# Patient Record
Sex: Female | Born: 1995 | Race: Black or African American | Hispanic: No | Marital: Single | Smoking: Former smoker
Health system: Southern US, Community
[De-identification: ages and names within clinical notes are randomized; demographics above are authoritative.]

## PROBLEM LIST (undated history)

## (undated) DIAGNOSIS — G8929 Other chronic pain: Secondary | ICD-10-CM

## (undated) DIAGNOSIS — R112 Nausea with vomiting, unspecified: Secondary | ICD-10-CM

## (undated) DIAGNOSIS — N926 Irregular menstruation, unspecified: Secondary | ICD-10-CM

## (undated) DIAGNOSIS — R6884 Jaw pain: Secondary | ICD-10-CM

## (undated) DIAGNOSIS — R143 Flatulence: Secondary | ICD-10-CM

## (undated) DIAGNOSIS — R141 Gas pain: Secondary | ICD-10-CM

## (undated) DIAGNOSIS — K219 Gastro-esophageal reflux disease without esophagitis: Secondary | ICD-10-CM

## (undated) DIAGNOSIS — M65849 Other synovitis and tenosynovitis, unspecified hand: Secondary | ICD-10-CM

## (undated) DIAGNOSIS — B373 Candidiasis of vulva and vagina: Secondary | ICD-10-CM

## (undated) DIAGNOSIS — M65839 Other synovitis and tenosynovitis, unspecified forearm: Secondary | ICD-10-CM

## (undated) DIAGNOSIS — H6120 Impacted cerumen, unspecified ear: Secondary | ICD-10-CM

## (undated) DIAGNOSIS — F32A Depression, unspecified: Secondary | ICD-10-CM

## (undated) DIAGNOSIS — F329 Major depressive disorder, single episode, unspecified: Secondary | ICD-10-CM

## (undated) DIAGNOSIS — L509 Urticaria, unspecified: Secondary | ICD-10-CM

## (undated) DIAGNOSIS — Q828 Other specified congenital malformations of skin: Secondary | ICD-10-CM

## (undated) DIAGNOSIS — M419 Scoliosis, unspecified: Secondary | ICD-10-CM

## (undated) DIAGNOSIS — R142 Eructation: Secondary | ICD-10-CM

## (undated) DIAGNOSIS — B354 Tinea corporis: Secondary | ICD-10-CM

## (undated) DIAGNOSIS — R1031 Right lower quadrant pain: Secondary | ICD-10-CM

## (undated) DIAGNOSIS — N72 Inflammatory disease of cervix uteri: Secondary | ICD-10-CM

## (undated) DIAGNOSIS — D573 Sickle-cell trait: Secondary | ICD-10-CM

## (undated) DIAGNOSIS — M25571 Pain in right ankle and joints of right foot: Secondary | ICD-10-CM

## (undated) DIAGNOSIS — R109 Unspecified abdominal pain: Secondary | ICD-10-CM

## (undated) HISTORY — DX: Impacted cerumen, unspecified ear: H61.20

## (undated) HISTORY — PX: WISDOM TOOTH EXTRACTION: SHX21

## (undated) HISTORY — DX: Inflammatory disease of cervix uteri: N72

## (undated) HISTORY — DX: Eructation: R14.2

## (undated) HISTORY — DX: Tinea corporis: B35.4

## (undated) HISTORY — DX: Other specified congenital malformations of skin: Q82.8

## (undated) HISTORY — DX: Jaw pain: R68.84

## (undated) HISTORY — DX: Candidiasis of vulva and vagina: B37.3

## (undated) HISTORY — DX: Right lower quadrant pain: R10.31

## (undated) HISTORY — DX: Other synovitis and tenosynovitis, unspecified forearm: M65.839

## (undated) HISTORY — DX: Depression, unspecified: F32.A

## (undated) HISTORY — DX: Major depressive disorder, single episode, unspecified: F32.9

## (undated) HISTORY — DX: Urticaria, unspecified: L50.9

## (undated) HISTORY — DX: Flatulence: R14.3

## (undated) HISTORY — DX: Gas pain: R14.1

## (undated) HISTORY — DX: Pain in right ankle and joints of right foot: M25.571

## (undated) HISTORY — DX: Other synovitis and tenosynovitis, unspecified hand: M65.849

## (undated) HISTORY — PX: EXTERNAL EAR SURGERY: SHX627

---
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
## 2013-01-06 ENCOUNTER — Ambulatory Visit (INDEPENDENT_AMBULATORY_CARE_PROVIDER_SITE_OTHER): Payer: Medicaid Other | Admitting: Pediatrics

## 2013-01-06 ENCOUNTER — Encounter: Payer: Self-pay | Admitting: Pediatrics

## 2013-01-06 VITALS — BP 110/70 | Wt 144.6 lb

## 2013-01-06 DIAGNOSIS — R05 Cough: Secondary | ICD-10-CM

## 2013-01-06 DIAGNOSIS — N899 Noninflammatory disorder of vagina, unspecified: Secondary | ICD-10-CM

## 2013-01-06 DIAGNOSIS — R197 Diarrhea, unspecified: Secondary | ICD-10-CM

## 2013-01-06 DIAGNOSIS — F411 Generalized anxiety disorder: Secondary | ICD-10-CM

## 2013-01-06 DIAGNOSIS — Z3202 Encounter for pregnancy test, result negative: Secondary | ICD-10-CM

## 2013-01-06 DIAGNOSIS — M25579 Pain in unspecified ankle and joints of unspecified foot: Secondary | ICD-10-CM

## 2013-01-06 DIAGNOSIS — R109 Unspecified abdominal pain: Secondary | ICD-10-CM

## 2013-01-06 DIAGNOSIS — M25571 Pain in right ankle and joints of right foot: Secondary | ICD-10-CM

## 2013-01-06 DIAGNOSIS — J309 Allergic rhinitis, unspecified: Secondary | ICD-10-CM

## 2013-01-06 DIAGNOSIS — N898 Other specified noninflammatory disorders of vagina: Secondary | ICD-10-CM

## 2013-01-06 DIAGNOSIS — K589 Irritable bowel syndrome without diarrhea: Secondary | ICD-10-CM | POA: Insufficient documentation

## 2013-01-06 DIAGNOSIS — Z309 Encounter for contraceptive management, unspecified: Secondary | ICD-10-CM

## 2013-01-06 DIAGNOSIS — Z1389 Encounter for screening for other disorder: Secondary | ICD-10-CM

## 2013-01-06 LAB — SEDIMENTATION RATE: Sed Rate: 4 mm/hr (ref 0–22)

## 2013-01-06 LAB — POCT URINALYSIS DIPSTICK
Bilirubin, UA: NEGATIVE
Blood, UA: 50
Nitrite, UA: NEGATIVE
Urobilinogen, UA: NEGATIVE
pH, UA: 5

## 2013-01-06 LAB — POCT URINE PREGNANCY: Preg Test, Ur: NEGATIVE

## 2013-01-06 LAB — LIPASE: Lipase: 30 U/L (ref 0–75)

## 2013-01-06 MED ORDER — ALBUTEROL SULFATE HFA 108 (90 BASE) MCG/ACT IN AERS: 2.0000 | INHALATION_SPRAY | RESPIRATORY_TRACT | Status: DC | PRN

## 2013-01-06 MED ORDER — ETONOGESTREL-ETHINYL ESTRADIOL 0.12-0.015 MG/24HR VA RING: VAGINAL_RING | VAGINAL | Status: DC

## 2013-01-06 MED ORDER — BECLOMETHASONE DIPROPIONATE 80 MCG/ACT IN AERS: 1.0000 | INHALATION_SPRAY | Freq: Two times a day (BID) | RESPIRATORY_TRACT | Status: DC
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
## 2013-01-09 ENCOUNTER — Emergency Department (HOSPITAL_COMMUNITY)
Admission: EM | Admit: 2013-01-09 | Discharge: 2013-01-10 | Disposition: A | Discharge status: Home / Self Care | Payer: Medicaid Other | Attending: Emergency Medicine | Admitting: Emergency Medicine

## 2013-01-09 ENCOUNTER — Encounter (HOSPITAL_COMMUNITY): Payer: Self-pay | Admitting: Emergency Medicine

## 2013-01-09 DIAGNOSIS — F17200 Nicotine dependence, unspecified, uncomplicated: Secondary | ICD-10-CM | POA: Insufficient documentation

## 2013-01-09 DIAGNOSIS — Z79899 Other long term (current) drug therapy: Secondary | ICD-10-CM | POA: Insufficient documentation

## 2013-01-09 DIAGNOSIS — IMO0002 Reserved for concepts with insufficient information to code with codable children: Secondary | ICD-10-CM | POA: Insufficient documentation

## 2013-01-09 DIAGNOSIS — N83209 Unspecified ovarian cyst, unspecified side: Secondary | ICD-10-CM | POA: Insufficient documentation

## 2013-01-09 DIAGNOSIS — R109 Unspecified abdominal pain: Secondary | ICD-10-CM

## 2013-01-09 DIAGNOSIS — Z3202 Encounter for pregnancy test, result negative: Secondary | ICD-10-CM | POA: Insufficient documentation

## 2013-01-09 DIAGNOSIS — Q828 Other specified congenital malformations of skin: Secondary | ICD-10-CM | POA: Insufficient documentation

## 2013-01-09 DIAGNOSIS — K5900 Constipation, unspecified: Secondary | ICD-10-CM | POA: Insufficient documentation

## 2013-01-09 DIAGNOSIS — Z8619 Personal history of other infectious and parasitic diseases: Secondary | ICD-10-CM | POA: Insufficient documentation

## 2013-01-09 DIAGNOSIS — Z8739 Personal history of other diseases of the musculoskeletal system and connective tissue: Secondary | ICD-10-CM | POA: Insufficient documentation

## 2013-01-09 DIAGNOSIS — Z862 Personal history of diseases of the blood and blood-forming organs and certain disorders involving the immune mechanism: Secondary | ICD-10-CM | POA: Insufficient documentation

## 2013-01-09 DIAGNOSIS — Z8669 Personal history of other diseases of the nervous system and sense organs: Secondary | ICD-10-CM | POA: Insufficient documentation

## 2013-01-09 DIAGNOSIS — Z872 Personal history of diseases of the skin and subcutaneous tissue: Secondary | ICD-10-CM | POA: Insufficient documentation

## 2013-01-09 DIAGNOSIS — R1031 Right lower quadrant pain: Secondary | ICD-10-CM | POA: Insufficient documentation

## 2013-01-09 HISTORY — DX: Gastro-esophageal reflux disease without esophagitis: K21.9

## 2013-01-09 HISTORY — DX: Sickle-cell trait: D57.3

## 2013-01-09 LAB — CBC WITH DIFFERENTIAL/PLATELET
Basophils Absolute: 0 10*3/uL (ref 0.0–0.1)
Basophils Relative: 1 % (ref 0–1)
Eosinophils Absolute: 0 10*3/uL (ref 0.0–0.7)
Eosinophils Relative: 0 % (ref 0–5)
Hemoglobin: 13.8 g/dL (ref 12.0–15.0)
MCH: 27.5 pg (ref 26.0–34.0)
MCHC: 35 g/dL (ref 30.0–36.0)
Monocytes Relative: 6 % (ref 3–12)
Neutrophils Relative %: 58 % (ref 43–77)
Platelets: 367 10*3/uL (ref 150–400)

## 2013-01-09 LAB — COMPREHENSIVE METABOLIC PANEL
AST: 12 U/L (ref 0–37)
Albumin: 4.3 g/dL (ref 3.5–5.2)
Alkaline Phosphatase: 106 U/L (ref 39–117)
BUN: 12 mg/dL (ref 6–23)
Calcium: 9.4 mg/dL (ref 8.4–10.5)
Creatinine, Ser: 0.72 mg/dL (ref 0.50–1.10)
Potassium: 3.7 mEq/L (ref 3.5–5.1)
Sodium: 140 mEq/L (ref 135–145)
Total Protein: 7.6 g/dL (ref 6.0–8.3)

## 2013-01-09 LAB — URINALYSIS, ROUTINE W REFLEX MICROSCOPIC
Glucose, UA: NEGATIVE mg/dL
Leukocytes, UA: NEGATIVE
Nitrite: NEGATIVE
Specific Gravity, Urine: 1.017 (ref 1.005–1.030)
pH: 6 (ref 5.0–8.0)

## 2013-01-09 LAB — URINE MICROSCOPIC-ADD ON

## 2013-01-09 LAB — POCT PREGNANCY, URINE: Preg Test, Ur: NEGATIVE

## 2013-01-09 NOTE — ED Notes (Addendum)
abd pain since July sharp and dull has seen a dr has ring for [facility] but just put it in last night  So no BC since June when she came off depo shot  Vag bleeding now  Feels pressure when she voids has some bleeding
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
## 2013-01-10 ENCOUNTER — Emergency Department (HOSPITAL_COMMUNITY): Payer: Medicaid Other

## 2013-01-10 ENCOUNTER — Telehealth: Payer: Self-pay | Admitting: Pediatrics

## 2013-01-10 LAB — WET PREP, GENITAL
Clue Cells Wet Prep HPF POC: NONE SEEN
Yeast Wet Prep HPF POC: NONE SEEN

## 2013-01-10 LAB — GC/CHLAMYDIA PROBE AMP: CT Probe RNA: NEGATIVE

## 2013-01-10 MED ORDER — MELOXICAM 15 MG PO TABS: 15.0000 mg | ORAL_TABLET | Freq: Every day | ORAL | Status: DC

## 2013-01-10 MED ORDER — OXYCODONE-ACETAMINOPHEN 5-325 MG PO TABS
2.0000 | ORAL_TABLET | Freq: Once | ORAL | Status: AC
  Administered 2013-01-10: 2 via ORAL
  Filled 2013-01-10: qty 2

## 2013-01-10 NOTE — ED Notes (Signed)
Pt resting and watching television.  St's pain has improved after pain med.

## 2013-01-10 NOTE — ED Notes (Signed)
Pt to ultrasound at this time.

## 2013-01-10 NOTE — ED Notes (Signed)
Pt returned from ultrasound

## 2013-01-10 NOTE — ED Provider Notes (Signed)
Medical screening examination/treatment/procedure(s) were performed by non-physician practitioner and as supervising physician I was immediately available for consultation/collaboration.  EKG Interpretation   None        Juliet Rude. Rubin Payor, MD 01/10/13 [phone]

## 2013-01-10 NOTE — Telephone Encounter (Signed)
Spoke with patient about the GYN referral that was suggested by the ED- Dr. Marina Goodell felt she should see the GI doctor as the pain she is having is on the right and not on the left where the small cyst was seen on [place], Patient agreed.  She is aware of her GI appointment with Dr. Madilyn Fireman on 01/25/2013 @ 11am.  She also aware of her appt with the Dr. Charlett Blake on 01/18/2013 @ 3:45p.  Patient reported going to the ED for sharp pains and a heavy feeling in her perineum. She tried to call nurse answering service but have to leave a message and felt her pain was to great to wait.  We discussed that unless a true emergency she should wait for the call back from the nurse as she may be helped without having to go to the ED.  I instructed her to keep her appt with Dr. Marina Goodell on 02/07/2013, I also asked her to let us know if the pain was worsening as we may be able to get the GI appt sooner if symptoms worsen.  She was given Percocet/Roxicet in the ED one time dose and she states it is still helping to decrease the pain, she was given a rx for Mobic 15mg  that she has not needed to fill yet.  She is reporting some nausea that started about two weeks ago and we not reported to Dr. Marina Goodell.  She would like to try taking the Zofran 4mg  that she has at home, Dr. Epimenio Sarin aware and instructed ok to take 1 tablet q 8hrs for nausea- if she needs to take it more than 2 times she should come back in to see Dr. Marina Goodell.  Dr. Carlynn Purl made aware since she is on call this week.  She asked if she should keep a diary to take to the GI appointment She will write the foods, pain, bowel movements and any other symptoms she may have.

## 2013-01-14 ENCOUNTER — Encounter (HOSPITAL_COMMUNITY): Payer: Self-pay | Admitting: Emergency Medicine

## 2013-01-14 ENCOUNTER — Emergency Department (INDEPENDENT_AMBULATORY_CARE_PROVIDER_SITE_OTHER)
Admission: EM | Admit: 2013-01-14 | Discharge: 2013-01-14 | Disposition: A | Discharge status: Home / Self Care | Payer: Medicaid Other | Source: Home / Self Care | Attending: Family Medicine | Admitting: Family Medicine

## 2013-01-14 DIAGNOSIS — H00016 Hordeolum externum left eye, unspecified eyelid: Secondary | ICD-10-CM

## 2013-01-14 DIAGNOSIS — H00019 Hordeolum externum unspecified eye, unspecified eyelid: Secondary | ICD-10-CM

## 2013-01-14 MED ORDER — POLYMYXIN B-TRIMETHOPRIM 10000-0.1 UNIT/ML-% OP SOLN: 2.0000 [drp] | OPHTHALMIC | Status: DC

## 2013-01-14 NOTE — ED Notes (Signed)
See physician note
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
## 2013-01-14 NOTE — Discharge Instructions (Signed)
Thank you for coming in today. Use the eye drops in left eye Apply warm compress frequently for the next several days Followup with your primary care provider or your eye doctor soon  Sty A sty (hordeolum) is an infection of a gland in the eyelid located at the base of the eyelash. A sty may develop a white or yellow head of pus. It can be puffy (swollen). Usually, the sty will burst and pus will come out on its own. They do not leave lumps in the eyelid once they drain. A sty is often confused with another form of cyst of the eyelid called a chalazion. Chalazions occur within the eyelid and not on the edge where the bases of the eyelashes are. They often are red, sore and then form firm lumps in the eyelid. CAUSES   Germs (bacteria).  Lasting (chronic) eyelid inflammation. SYMPTOMS   Tenderness, redness and swelling along the edge of the eyelid at the base of the eyelashes.  Sometimes, there is a white or yellow head of pus. It may or may not drain. DIAGNOSIS  An ophthalmologist will be able to distinguish between a sty and a chalazion and treat the condition appropriately.  TREATMENT   Styes are typically treated with warm packs (compresses) until drainage occurs.  In rare cases, medicines that kill germs (antibiotics) may be prescribed. These antibiotics may be in the form of drops, cream or pills.  If a hard lump has formed, it is generally necessary to do a small incision and remove the hardened contents of the cyst in a minor surgical procedure done in the office.  In suspicious cases, your caregiver may send the contents of the cyst to the lab to be certain that it is not a rare, but dangerous form of cancer of the glands of the eyelid. HOME CARE INSTRUCTIONS   Wash your hands often and dry them with a clean towel. Avoid touching your eyelid. This may spread the infection to other parts of the eye.  Apply heat to your eyelid for 10 to 20 minutes, several times a day, to ease  pain and help to heal it faster.  Do not squeeze the sty. Allow it to drain on its own. Wash your eyelid carefully 3 to 4 times per day to remove any pus. SEEK IMMEDIATE MEDICAL CARE IF:   Your eye becomes painful or puffy (swollen).  Your vision changes.  Your sty does not drain by itself within 3 days.  Your sty comes back within a short period of time, even with treatment.  You have redness (inflammation) around the eye.  You have a fever. Document Released: 10/08/2004 Document Revised: 03/23/2011 Document Reviewed: 06/12/2008 [facility] Patient Information 2014 [employer], [place].

## 2013-01-30 ENCOUNTER — Other Ambulatory Visit: Payer: Self-pay | Admitting: Orthopedic Surgery

## 2013-01-30 DIAGNOSIS — M25571 Pain in right ankle and joints of right foot: Secondary | ICD-10-CM

## 2013-02-02 ENCOUNTER — Ambulatory Visit
Admission: RE | Admit: 2013-02-02 | Discharge: 2013-02-02 | Disposition: A | Discharge status: Home / Self Care | Payer: PRIVATE HEALTH INSURANCE | Source: Ambulatory Visit | Attending: Orthopedic Surgery | Admitting: Orthopedic Surgery

## 2013-02-02 DIAGNOSIS — M25571 Pain in right ankle and joints of right foot: Secondary | ICD-10-CM

## 2013-02-07 ENCOUNTER — Ambulatory Visit: Payer: Medicaid Other | Admitting: Pediatrics

## 2013-02-09 ENCOUNTER — Encounter (HOSPITAL_COMMUNITY): Payer: Self-pay | Admitting: Emergency Medicine

## 2013-02-09 ENCOUNTER — Emergency Department (HOSPITAL_COMMUNITY)
Admission: EM | Admit: 2013-02-09 | Discharge: 2013-02-10 | Disposition: A | Discharge status: Home / Self Care | Payer: Medicaid Other | Attending: Emergency Medicine | Admitting: Emergency Medicine

## 2013-02-09 DIAGNOSIS — Z862 Personal history of diseases of the blood and blood-forming organs and certain disorders involving the immune mechanism: Secondary | ICD-10-CM | POA: Insufficient documentation

## 2013-02-09 DIAGNOSIS — Z87768 Personal history of other specified (corrected) congenital malformations of integument, limbs and musculoskeletal system: Secondary | ICD-10-CM | POA: Insufficient documentation

## 2013-02-09 DIAGNOSIS — Z872 Personal history of diseases of the skin and subcutaneous tissue: Secondary | ICD-10-CM | POA: Insufficient documentation

## 2013-02-09 DIAGNOSIS — R1031 Right lower quadrant pain: Secondary | ICD-10-CM | POA: Insufficient documentation

## 2013-02-09 DIAGNOSIS — Z87828 Personal history of other (healed) physical injury and trauma: Secondary | ICD-10-CM | POA: Insufficient documentation

## 2013-02-09 DIAGNOSIS — Z3202 Encounter for pregnancy test, result negative: Secondary | ICD-10-CM | POA: Insufficient documentation

## 2013-02-09 DIAGNOSIS — R109 Unspecified abdominal pain: Secondary | ICD-10-CM

## 2013-02-09 DIAGNOSIS — Z8742 Personal history of other diseases of the female genital tract: Secondary | ICD-10-CM | POA: Insufficient documentation

## 2013-02-09 DIAGNOSIS — Z8619 Personal history of other infectious and parasitic diseases: Secondary | ICD-10-CM | POA: Insufficient documentation

## 2013-02-09 DIAGNOSIS — R1110 Vomiting, unspecified: Secondary | ICD-10-CM | POA: Insufficient documentation

## 2013-02-09 DIAGNOSIS — Z8669 Personal history of other diseases of the nervous system and sense organs: Secondary | ICD-10-CM | POA: Insufficient documentation

## 2013-02-09 DIAGNOSIS — Z8719 Personal history of other diseases of the digestive system: Secondary | ICD-10-CM | POA: Insufficient documentation

## 2013-02-09 DIAGNOSIS — Z79899 Other long term (current) drug therapy: Secondary | ICD-10-CM | POA: Insufficient documentation

## 2013-02-09 DIAGNOSIS — Z792 Long term (current) use of antibiotics: Secondary | ICD-10-CM | POA: Insufficient documentation

## 2013-02-09 DIAGNOSIS — F17200 Nicotine dependence, unspecified, uncomplicated: Secondary | ICD-10-CM | POA: Insufficient documentation

## 2013-02-09 DIAGNOSIS — R1011 Right upper quadrant pain: Secondary | ICD-10-CM | POA: Insufficient documentation

## 2013-02-09 DIAGNOSIS — R197 Diarrhea, unspecified: Secondary | ICD-10-CM | POA: Insufficient documentation

## 2013-02-09 DIAGNOSIS — Z8776 Personal history of (corrected) congenital malformations of integument, limbs and musculoskeletal system: Secondary | ICD-10-CM | POA: Insufficient documentation

## 2013-02-09 DIAGNOSIS — Z8739 Personal history of other diseases of the musculoskeletal system and connective tissue: Secondary | ICD-10-CM | POA: Insufficient documentation

## 2013-02-09 LAB — URINALYSIS, ROUTINE W REFLEX MICROSCOPIC
Bilirubin Urine: NEGATIVE
Glucose, UA: NEGATIVE mg/dL
Hgb urine dipstick: NEGATIVE
Ketones, ur: NEGATIVE mg/dL
Nitrite: NEGATIVE
Protein, ur: NEGATIVE mg/dL
Specific Gravity, Urine: 1.018 (ref 1.005–1.030)
Urobilinogen, UA: 0.2 mg/dL (ref 0.0–1.0)
pH: 7 (ref 5.0–8.0)

## 2013-02-09 LAB — CBC WITH DIFFERENTIAL/PLATELET
BASOS ABS: 0 10*3/uL (ref 0.0–0.1)
BASOS PCT: 0 % (ref 0–1)
Eosinophils Absolute: 0 10*3/uL (ref 0.0–0.7)
Eosinophils Relative: 0 % (ref 0–5)
HCT: 39.1 % (ref 36.0–46.0)
Hemoglobin: 13.6 g/dL (ref 12.0–15.0)
LYMPHS PCT: 49 % — AB (ref 12–46)
Lymphs Abs: 2.5 10*3/uL (ref 0.7–4.0)
MCH: 28.2 pg (ref 26.0–34.0)
MCHC: 34.8 g/dL (ref 30.0–36.0)
MCV: 81 fL (ref 78.0–100.0)
Monocytes Absolute: 0.3 10*3/uL (ref 0.1–1.0)
Monocytes Relative: 6 % (ref 3–12)
NEUTROS PCT: 44 % (ref 43–77)
Neutro Abs: 2.3 10*3/uL (ref 1.7–7.7)
Platelets: 352 10*3/uL (ref 150–400)
RBC: 4.83 MIL/uL (ref 3.87–5.11)
RDW: 14.4 % (ref 11.5–15.5)
WBC: 5.1 10*3/uL (ref 4.0–10.5)

## 2013-02-09 LAB — COMPREHENSIVE METABOLIC PANEL
ALT: 16 U/L (ref 0–35)
AST: 19 U/L (ref 0–37)
Albumin: 4.2 g/dL (ref 3.5–5.2)
Alkaline Phosphatase: 104 U/L (ref 39–117)
BUN: 14 mg/dL (ref 6–23)
CO2: 26 mEq/L (ref 19–32)
Calcium: 9.7 mg/dL (ref 8.4–10.5)
Chloride: 103 mEq/L (ref 96–112)
Creatinine, Ser: 0.9 mg/dL (ref 0.50–1.10)
GFR calc Af Amer: >90 mL/min (ref >90)
GFR calc non Af Amer: >90 mL/min (ref >90)
Glucose, Bld: 91 mg/dL (ref 70–99)
Potassium: 3.8 mEq/L (ref 3.7–5.3)
Sodium: 141 mEq/L (ref 137–147)
Total Bilirubin: 0.2 mg/dL — ABNORMAL LOW (ref 0.3–1.2)
Total Protein: 7.5 g/dL (ref 6.0–8.3)

## 2013-02-09 LAB — POCT PREGNANCY, URINE: Preg Test, Ur: NEGATIVE

## 2013-02-09 LAB — LIPASE, BLOOD: Lipase: 52 U/L (ref 11–59)

## 2013-02-09 LAB — URINE MICROSCOPIC-ADD ON

## 2013-02-09 NOTE — ED Notes (Signed)
Pt states she was diagnosed 2 days ago with parasites and a bacterial infection in her stomach. States she went to see a GI specialist "across the street". Was prescribed flagyl. States the pain in her stomach is getting worse. States shes had N/V/D constantly, has had multiple amounts of diarrhea, sometimes its so liquid it leaks. States she also has to pee a lot and "when I poop my feet go numb".

## 2013-02-09 NOTE — ED Notes (Signed)
Pt. reports right abdominal pain with intermittent emesis /diarrhea onset July last year , pt. stated she has " parasites " in her stomach diagnosed by her GI MD .

## 2013-02-10 MED ORDER — PROMETHAZINE HCL 25 MG PO TABS: 25.0000 mg | ORAL_TABLET | Freq: Four times a day (QID) | ORAL | Status: DC | PRN
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
## 2013-02-10 NOTE — Discharge Instructions (Signed)

## 2013-02-17 ENCOUNTER — Ambulatory Visit: Payer: Medicaid Other | Admitting: Pediatrics

## 2013-02-24 ENCOUNTER — Encounter: Payer: Self-pay | Admitting: Pediatrics

## 2013-02-24 ENCOUNTER — Ambulatory Visit (INDEPENDENT_AMBULATORY_CARE_PROVIDER_SITE_OTHER): Payer: Medicaid Other | Admitting: Pediatrics

## 2013-02-24 VITALS — BP 100/62 | Ht 65.55 in | Wt 142.8 lb

## 2013-02-24 DIAGNOSIS — Z309 Encounter for contraceptive management, unspecified: Secondary | ICD-10-CM

## 2013-02-24 DIAGNOSIS — R059 Cough, unspecified: Secondary | ICD-10-CM

## 2013-02-24 DIAGNOSIS — Z113 Encounter for screening for infections with a predominantly sexual mode of transmission: Secondary | ICD-10-CM

## 2013-02-24 DIAGNOSIS — R058 Other specified cough: Secondary | ICD-10-CM

## 2013-02-24 DIAGNOSIS — R05 Cough: Secondary | ICD-10-CM

## 2013-02-24 DIAGNOSIS — R109 Unspecified abdominal pain: Secondary | ICD-10-CM

## 2013-02-24 DIAGNOSIS — F411 Generalized anxiety disorder: Secondary | ICD-10-CM

## 2013-02-24 MED ORDER — ALBUTEROL SULFATE HFA 108 (90 BASE) MCG/ACT IN AERS: 1.0000 | INHALATION_SPRAY | Freq: Four times a day (QID) | RESPIRATORY_TRACT | Status: DC | PRN
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
## 2013-02-24 NOTE — Assessment & Plan Note (Addendum)
Patient seems to understand that there may be a connection between her anxiety and her multiple concerns each visit. She reports what sounds like a 30 minute panic attack a few days ago. - Patient and provider together agreed on regular visits to address multiple issues, every month. - Patient declined meeting with Jasmine today.

## 2013-02-24 NOTE — Patient Instructions (Signed)
It was good to see you today.  For your abdominal pain and diarrhea, it is good you are seeing GI. We will obtain these records to follow along.  For your cough, we have prescribed another albuterol inhaler. It is very important to remember to take QVAR daily.  For your nuvaring, it was not seen on exam today. Please schedule an appointment in 2 weeks for an IUD. You can take ibuprofen 600mg  the morning of your visit, ~1 hour before coming in, with food.  Let us plan on regular appointments ~ once monthly.

## 2013-02-24 NOTE — Assessment & Plan Note (Signed)
With diarrhea and abdominal pain both chronic issues, and patient reporting [facility] Madilyn FiremanHayes, would continue follow-up. She is afebrile with a nonacute abdomen. Symptoms possibly IBD with anxiety issues, could also be IBD though less likely given stable weight, no fevers, no reports of rectal fissures etc. Also could be related to previously seen ovarian cyst, though unlikely this pain should last this long. - Will ask clinic staff to help obtain records from visits with [facility] so we can follow along. - Recommended not using waist trainers which patient agrees with.

## 2013-02-24 NOTE — Assessment & Plan Note (Signed)
Patient reports taking albuterol >2x/week, only taking QVAR 3-4 times weekly due to forgetting. Not wheezing and has normal work of breathing today. - Refilled albuterol today. - Strongly encouraged daily QVAR for better control.

## 2013-02-24 NOTE — Assessment & Plan Note (Signed)
No nuvaring seen on speculum exam or felt on bimanual exam. Likely fell out during sex or in toilet. - F/u in 2 weeks for IUD placement. - Recommended ibuprofen 600mg  the morning of visit. - Patient reports not being sexually active but if sexually active with female, should use condom to protect against pregnancy/STD. Discussed that no method of contraception is 100% effective other than abstinence.

## 2013-02-25 LAB — HIV ANTIBODY (ROUTINE TESTING W REFLEX): HIV: NONREACTIVE

## 2013-02-25 LAB — WET PREP BY MOLECULAR PROBE
Candida species: NEGATIVE
Gardnerella vaginalis: NEGATIVE
Trichomonas vaginosis: NEGATIVE

## 2013-02-25 LAB — GC/CHLAMYDIA PROBE AMP
CT Probe RNA: NEGATIVE
GC PROBE AMP APTIMA: NEGATIVE

## 2013-02-26 NOTE — Progress Notes (Signed)
I saw and evaluated the patient, performing the key elements of the service.  I developed the management plan that is described in the resident's note, and I agree with the content.  We will continue to work on reducing ER usage and address patient's ongoing anxiety about physical symptoms.  In addition, pt has some chronic symptoms that need further work-up and attention.  Advised that pt keep her upcoming appts with GI specialist and follow-up regularly with me to ensure issues are regularly addressed until resolved or reassured.

## 2013-03-01 ENCOUNTER — Telehealth: Payer: Self-pay

## 2013-03-01 NOTE — Telephone Encounter (Signed)
Called and advised patient that her labs are WNL.  She requested a copy that I will leave for her in a sealed envelope at the front desk.  She went to the GI specialist who states she may have IBS.  He prescribed her a medication that cost $77 so she did not get it.  She has a follow up with us on 2/26 and will discuss further with Dr. Marina GoodellPerry.

## 2013-03-01 NOTE — Telephone Encounter (Signed)
Message copied by Ovidio HangerARTER, Natacia Chaisson H on Wed Mar 01, 2013  1:05 PM ------      Message from: Delorse LekPERRY, MARTHA F      Created: Sun Feb 26, 2013 12:13 PM       Please notify patient/caregiver that the recent lab results were normal.       ------

## 2013-03-09 ENCOUNTER — Ambulatory Visit: Payer: Self-pay | Admitting: Pediatrics

## 2013-03-10 ENCOUNTER — Encounter: Payer: Self-pay | Admitting: Pediatrics

## 2013-03-21 ENCOUNTER — Encounter: Payer: Self-pay | Admitting: Pediatrics

## 2013-03-21 ENCOUNTER — Ambulatory Visit (INDEPENDENT_AMBULATORY_CARE_PROVIDER_SITE_OTHER): Payer: Medicaid Other | Admitting: Pediatrics

## 2013-03-21 VITALS — BP 110/68 | Wt 143.8 lb

## 2013-03-21 DIAGNOSIS — Z309 Encounter for contraceptive management, unspecified: Secondary | ICD-10-CM

## 2013-03-21 DIAGNOSIS — Z30430 Encounter for insertion of intrauterine contraceptive device: Secondary | ICD-10-CM

## 2013-03-21 DIAGNOSIS — R109 Unspecified abdominal pain: Secondary | ICD-10-CM

## 2013-03-21 DIAGNOSIS — Z975 Presence of (intrauterine) contraceptive device: Secondary | ICD-10-CM | POA: Insufficient documentation

## 2013-03-21 LAB — POCT URINE PREGNANCY: Preg Test, Ur: NEGATIVE

## 2013-03-21 MED ORDER — LEVONORGESTREL 13.5 MG IU IUD: 13.5000 mg | INTRAUTERINE_SYSTEM | Freq: Once | INTRAUTERINE | Status: DC

## 2013-03-21 MED ORDER — IBUPROFEN 600 MG PO TABS: 600.0000 mg | ORAL_TABLET | Freq: Four times a day (QID) | ORAL | Status: DC | PRN

## 2013-03-21 NOTE — Patient Instructions (Addendum)
We inserted a Skyla intrauterine device today. Continue to use condoms for the next 7 days to prevent pregnancy.   We have sent Ibuprofen 600 mg every 6 hours for cramping for the next 2 days.   To check to make sure your Giardia infection has been treated you should [facility] and get the collection cup to do a stool sample at home. Once you have given a stool sample then [facility].   Follow up with Dr. Marina GoodellPerry in 4 weeks.

## 2013-03-21 NOTE — Progress Notes (Signed)
Adolescent Medicine Consultation Follow-Up Visit Debra [name]  is a 18 y.o. female here today for follow-up of contraception management.   PCP Confirmed?  Yes  Debra [name], Debra Clos, Debra [name]   History was provided by the patient.  Chart review:  Last seen by Dr. Marina Goodell on 02/24/2013.  Treatment plan at last visit was discussion of IUD placement and following up with [name] for chronic abdominal pain .   Patient's last menstrual period was 02/27/2013.  Last STI screen: 02/24/2013 Other Labs: HIV negative Immunizations: up-to-date     HPI:  Here for IUD placement. Pt reports last menstrual period starting on 2/14 lasting 2-3 days. No excessive bleeding or clots.  Last unprotected sexual intercourse: years, using condoms with every encounter.  Partner changed recently about 1-1.5 months ago. Partner has not changed since last STI check.   History of abdominal pain that seems to be 2 separate types of pain.  Was having epigastric pain associated with nausea and the other pain Debra [name] report starting in her genital area and radiates up into her R flank.  Diagnosed with Giardia by Debra [name]) and treated with Metronidazole, about 1 month ago.  Her epigastric pain and nausea seems to have greatly improved but still with flank pain.  Still having some nausea and diarrhea but better.  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  Debra [name] was unable to afford medicine ($70) and not started meds.  Has been self-medicating her nausea with marijuana in the meantime. Has helped her to eat. Has had no help with Promethazine or Zofran.  Also using clove oil 2-3 times in water and another herbal (Grandmother gave) in case the Giardia infecton is still there.  Debra [name] is resistant to paying $70 a month when she doesn't know if she's cleared the Giardia yet.  No other stool sample done.  ROS as above   Problem List Reviewed:  yes Medication  List Reviewed:   yes   Physical Exam:  Filed Vitals:   03/21/13 1215  BP: 110/68  Weight: 143 lb 12.8 oz (65.227 kg)   BP 110/68  Wt 143 lb 12.8 oz (65.227 kg)  LMP 02/27/2013 Body mass index: body mass index is 23.53 kg/(m^2). No height on file for this encounter.  GEN: Well appearing, well nourished, alert adolescent female in no acute distress.  HEENT: Sclera clear, EOMI. Moist mucous membranes.  PULM:  Unlabored respirations.  Clear to auscultation bilaterally with no wheezes or crackles.  No accessory muscle use. CARDIO:  Regular rate and rhythm.  No murmurs.  2+ radial pulses [name]:  Soft, non tender, non distended.  Normoactive bowel sounds.  No masses.  No hepatosplenomegaly.  GU: Tanner stage V female genitalia.  Normal appearing genitalia and mucosa. No discharge or lesions. No cervical motion tenderness on bimanual exam. Normal appearing cervix.   NEURO: Alert and oriented. Normal mood and affect. No focal deficits.     Results for orders placed in visit on 03/21/13 (from the past 24 hour(s))  POCT URINE PREGNANCY     Status: None   Collection Time    03/21/13 12:32 PM      Result Value Ref Range   Preg Test, Ur Negative      Assessment/Plan Debra [name] is a 18 year old with history of generalized anxiety disorder, Giardia infection, and chronic abdominal pain presenting today for contraceptive management and IUD insertion.  - Discussed IUD contraception  with Debra [name] including insertion, risks, and benefits. Skyla IUD was placed today. Debra [name] tolerated procedure well.  Given instructions for post-procedure care.  See Dr. Lamar [name]'s separate procedure note.  - Ibuprofen 600 mg every 6 hours as needed for cramping for the next 2 days  - Follow up GC/Chlamydia genital swabs.  - Will repeat stool Giardia antigen today as a test of cure. [facility].   - Follow up in 4 weeks for IUD follow up   Medical decision-making:  - 40  minutes spent, more than 50% of appointment was spent discussing diagnosis and management of symptoms  Walden FieldEmily Dunston Darriel Utter, Debra [name] [facility] PGY-2 03/21/2013 9:43 PM  .

## 2013-03-22 ENCOUNTER — Telehealth: Payer: Self-pay

## 2013-03-22 LAB — GC/CHLAMYDIA PROBE AMP
CT Probe RNA: NEGATIVE
GC Probe RNA: NEGATIVE

## 2013-03-22 NOTE — Telephone Encounter (Signed)
Still in pain this morning.  Taking medication. Some clear discharge; no bleeding.  Wishes to stay home today and rest and take medication.  I wrote her a note and she will pick up.

## 2013-04-02 ENCOUNTER — Encounter (HOSPITAL_COMMUNITY): Payer: Self-pay | Admitting: Emergency Medicine

## 2013-04-02 ENCOUNTER — Emergency Department (HOSPITAL_COMMUNITY)
Admission: EM | Admit: 2013-04-02 | Discharge: 2013-04-02 | Disposition: A | Discharge status: Home / Self Care | Payer: Medicaid Other | Attending: Emergency Medicine | Admitting: Emergency Medicine

## 2013-04-02 DIAGNOSIS — Z8619 Personal history of other infectious and parasitic diseases: Secondary | ICD-10-CM | POA: Insufficient documentation

## 2013-04-02 DIAGNOSIS — Z862 Personal history of diseases of the blood and blood-forming organs and certain disorders involving the immune mechanism: Secondary | ICD-10-CM | POA: Insufficient documentation

## 2013-04-02 DIAGNOSIS — R112 Nausea with vomiting, unspecified: Secondary | ICD-10-CM | POA: Insufficient documentation

## 2013-04-02 DIAGNOSIS — G8918 Other acute postprocedural pain: Secondary | ICD-10-CM | POA: Insufficient documentation

## 2013-04-02 DIAGNOSIS — Z8669 Personal history of other diseases of the nervous system and sense organs: Secondary | ICD-10-CM | POA: Insufficient documentation

## 2013-04-02 DIAGNOSIS — R109 Unspecified abdominal pain: Secondary | ICD-10-CM | POA: Insufficient documentation

## 2013-04-02 DIAGNOSIS — Z872 Personal history of diseases of the skin and subcutaneous tissue: Secondary | ICD-10-CM | POA: Insufficient documentation

## 2013-04-02 DIAGNOSIS — R102 Pelvic and perineal pain: Secondary | ICD-10-CM

## 2013-04-02 DIAGNOSIS — N898 Other specified noninflammatory disorders of vagina: Secondary | ICD-10-CM | POA: Insufficient documentation

## 2013-04-02 DIAGNOSIS — Z3202 Encounter for pregnancy test, result negative: Secondary | ICD-10-CM | POA: Insufficient documentation

## 2013-04-02 DIAGNOSIS — Z79899 Other long term (current) drug therapy: Secondary | ICD-10-CM | POA: Insufficient documentation

## 2013-04-02 DIAGNOSIS — Z87828 Personal history of other (healed) physical injury and trauma: Secondary | ICD-10-CM | POA: Insufficient documentation

## 2013-04-02 DIAGNOSIS — M549 Dorsalgia, unspecified: Secondary | ICD-10-CM | POA: Insufficient documentation

## 2013-04-02 DIAGNOSIS — R197 Diarrhea, unspecified: Secondary | ICD-10-CM | POA: Insufficient documentation

## 2013-04-02 DIAGNOSIS — F17200 Nicotine dependence, unspecified, uncomplicated: Secondary | ICD-10-CM | POA: Insufficient documentation

## 2013-04-02 DIAGNOSIS — Z8719 Personal history of other diseases of the digestive system: Secondary | ICD-10-CM | POA: Insufficient documentation

## 2013-04-02 DIAGNOSIS — N949 Unspecified condition associated with female genital organs and menstrual cycle: Secondary | ICD-10-CM | POA: Insufficient documentation

## 2013-04-02 LAB — URINALYSIS, ROUTINE W REFLEX MICROSCOPIC
BILIRUBIN URINE: NEGATIVE
Glucose, UA: NEGATIVE mg/dL
KETONES UR: NEGATIVE mg/dL
Leukocytes, UA: NEGATIVE
NITRITE: NEGATIVE
PH: 6 (ref 5.0–8.0)
Protein, ur: NEGATIVE mg/dL
SPECIFIC GRAVITY, URINE: 1.017 (ref 1.005–1.030)
Urobilinogen, UA: 0.2 mg/dL (ref 0.0–1.0)

## 2013-04-02 LAB — BASIC METABOLIC PANEL
BUN: 8 mg/dL (ref 6–23)
CHLORIDE: 101 meq/L (ref 96–112)
CO2: 25 mEq/L (ref 19–32)
CREATININE: 0.76 mg/dL (ref 0.50–1.10)
Calcium: 9.8 mg/dL (ref 8.4–10.5)
GFR calc Af Amer: >90 mL/min (ref >90)
GFR calc non Af Amer: >90 mL/min (ref >90)
GLUCOSE: 89 mg/dL (ref 70–99)
Potassium: 3.6 mEq/L — ABNORMAL LOW (ref 3.7–5.3)
Sodium: 140 mEq/L (ref 137–147)

## 2013-04-02 LAB — URINE MICROSCOPIC-ADD ON

## 2013-04-02 LAB — PREGNANCY, URINE: Preg Test, Ur: NEGATIVE

## 2013-04-02 LAB — WET PREP, GENITAL
Clue Cells Wet Prep HPF POC: NONE SEEN
TRICH WET PREP: NONE SEEN
Yeast Wet Prep HPF POC: NONE SEEN

## 2013-04-02 MED ORDER — METOCLOPRAMIDE HCL 10 MG PO TABS: 10.0000 mg | ORAL_TABLET | Freq: Four times a day (QID) | ORAL | Status: DC | PRN

## 2013-04-02 MED ORDER — HYDROCODONE-ACETAMINOPHEN 5-325 MG PO TABS: 1.0000 | ORAL_TABLET | ORAL | Status: DC | PRN

## 2013-04-02 MED ORDER — HYDROCODONE-ACETAMINOPHEN 5-325 MG PO TABS
1.0000 | ORAL_TABLET | Freq: Once | ORAL | Status: AC
  Administered 2013-04-02: 1 via ORAL
  Filled 2013-04-02: qty 1

## 2013-04-02 MED ORDER — METOCLOPRAMIDE HCL 10 MG PO TABS
10.0000 mg | ORAL_TABLET | Freq: Once | ORAL | Status: AC
  Administered 2013-04-02: 10 mg via ORAL
  Filled 2013-04-02: qty 1
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
## 2013-04-02 NOTE — ED Notes (Signed)
She c/o diffuse low back discomfort ever since she had I.U.D. Inserted ~2 weeks ago.  She further c/o several episodes of diarrhea since yesterday.  She is in no distress.

## 2013-04-02 NOTE — Discharge Instructions (Signed)
Diarrhea Diarrhea is frequent loose and watery bowel movements. It can cause you to feel weak and dehydrated. Dehydration can cause you to become tired and thirsty, have a dry mouth, and have decreased urination that often is dark yellow. Diarrhea is a sign of another problem, most often an infection that will not last long. In most cases, diarrhea typically lasts 2 3 days. However, it can last longer if it is a sign of something more serious. It is important to treat your diarrhea as directed by your caregive to lessen or prevent future episodes of diarrhea. CAUSES  Some common causes include:  Gastrointestinal infections caused by viruses, bacteria, or parasites.  Food poisoning or food allergies.  Certain medicines, such as antibiotics, chemotherapy, and laxatives.  Artificial sweeteners and fructose.  Digestive disorders. HOME CARE INSTRUCTIONS  Ensure adequate fluid intake (hydration): have 1 cup (8 oz) of fluid for each diarrhea episode. Avoid fluids that contain simple sugars or sports drinks, fruit juices, whole milk products, and sodas. Your urine should be clear or pale yellow if you are drinking enough fluids. Hydrate with an oral rehydration solution that you can purchase at pharmacies, retail stores, and online. You can prepare an oral rehydration solution at home by mixing the following ingredients together:    tsp table salt.   tsp baking soda.   tsp salt substitute containing potassium chloride.  1  tablespoons sugar.  1 L (34 oz) of water.  Certain foods and beverages may increase the speed at which food moves through the gastrointestinal (GI) tract. These foods and beverages should be avoided and include:  Caffeinated and alcoholic beverages.  High-fiber foods, such as raw fruits and vegetables, nuts, seeds, and whole grain breads and cereals.  Foods and beverages sweetened with sugar alcohols, such as xylitol, sorbitol, and mannitol.  Some foods may be well  tolerated and may help thicken stool including:  Starchy foods, such as rice, toast, pasta, low-sugar cereal, oatmeal, grits, baked potatoes, crackers, and bagels.  Bananas.  Applesauce.  Add probiotic-rich foods to help increase healthy bacteria in the GI tract, such as yogurt and fermented milk products.  Wash your hands well after each diarrhea episode.  Only take over-the-counter or prescription medicines as directed by your caregiver.  Take a warm bath to relieve any burning or pain from frequent diarrhea episodes. SEEK IMMEDIATE MEDICAL CARE IF:   You are unable to keep fluids down.  You have persistent vomiting.  You have blood in your stool, or your stools are black and tarry.  You do not urinate in 6 8 hours, or there is only a small amount of very dark urine.  You have abdominal pain that increases or localizes.  You have weakness, dizziness, confusion, or lightheadedness.  You have a severe headache.  Your diarrhea gets worse or does not get better.  You have a fever or persistent symptoms for more than 2 3 days.  You have a fever and your symptoms suddenly get worse. MAKE SURE YOU:   Understand these instructions.  Will watch your condition.  Will get help right away if you are not doing well or get worse. Document Released: 12/19/2001 Document Revised: 12/16/2011 Document Reviewed: 09/06/2011 Exit[employer] Patient Information 2014 [employer].  

## 2013-04-02 NOTE — ED Provider Notes (Signed)
History/physical exam/procedure(s) were performed by non-physician practitioner and as supervising physician I was immediately available for consultation/collaboration. I have reviewed all notes and am in agreement with care and plan.   Hilario Quarryanielle S Nene Aranas, MD 04/02/13 [phone]

## 2013-04-04 LAB — OVA AND PARASITE EXAMINATION
Ova and parasites: NONE SEEN
SPECIAL REQUESTS: NORMAL

## 2013-04-06 LAB — STOOL CULTURE: Special Requests: NORMAL

## 2013-04-10 NOTE — Progress Notes (Signed)
Debra [name]   [facility].  No contraindications for placement.   The patient took 0.5 mg of Xanax prior to appt. 400 mcg mispristol inserted vaginally 4 hrs prior to procedure.   Patient's last menstrual period was 02/27/2013.  UHCG: Neg   Risks & benefits of IUD discussed  The IUD was purchased and supplied by [facility].  Packaging instructions supplied to patient  Consent form signed.  The patient denies any allergies to anesthetics or antiseptics.   Procedure:  Pt was placed in lithotomy position.  Uterus was palpated and noted in retroverted position.  Speculum was inserted.  GC/CT swab was used to collect sample for STI testing.  Tenaculum was used to stabilize the cervix by clasping at 12 o'clock  Betadine was used to clean the cervix and cervical os.  The uterus was sounded to 6 cm.  Christean GriefSkyla was inserted using manufacturer provided applicator.  Strings were trimmed to 3 cm external to os.  Tenaculum was removed.  Speculum was removed.   The patient was advised to move slowly from a supine to an upright position   The patient denied any concerns or complaints   The patient was instructed to schedule a follow-up appt in 1 month and to call sooner if any concerns.   The patient acknowledged agreement and understanding of the plan.

## 2013-04-11 ENCOUNTER — Telehealth: Payer: Self-pay

## 2013-04-11 NOTE — Telephone Encounter (Signed)
Called and left a VM for patient to call me with an update on her condition since her ED visit.  As reminded her of her 4/10 appointment.

## 2013-04-12 ENCOUNTER — Encounter: Payer: Self-pay | Admitting: Pediatrics

## 2013-04-12 ENCOUNTER — Ambulatory Visit (INDEPENDENT_AMBULATORY_CARE_PROVIDER_SITE_OTHER): Payer: Medicaid Other | Admitting: Pediatrics

## 2013-04-12 VITALS — Temp 98.2°F

## 2013-04-12 DIAGNOSIS — R109 Unspecified abdominal pain: Secondary | ICD-10-CM

## 2013-04-12 DIAGNOSIS — T169XXA Foreign body in ear, unspecified ear, initial encounter: Secondary | ICD-10-CM

## 2013-04-12 DIAGNOSIS — S0005XA Superficial foreign body of scalp, initial encounter: Secondary | ICD-10-CM

## 2013-04-12 DIAGNOSIS — Z309 Encounter for contraceptive management, unspecified: Secondary | ICD-10-CM

## 2013-04-12 DIAGNOSIS — S1095XA Superficial foreign body of unspecified part of neck, initial encounter: Secondary | ICD-10-CM

## 2013-04-12 DIAGNOSIS — S0085XA Superficial foreign body of other part of head, initial encounter: Secondary | ICD-10-CM

## 2013-04-12 MED ORDER — IBUPROFEN 600 MG PO TABS: 600.0000 mg | ORAL_TABLET | Freq: Four times a day (QID) | ORAL | Status: DC | PRN

## 2013-04-12 NOTE — Progress Notes (Signed)
History was provided by the patient.  Debra [name] is a 18 y.o. female who is here for earring stuck in ear and continued abdominal pain.     HPI:  18 year old female with history of chronic abdominal pain now with an embedded earring in her left earlobe.  Zahli reports that the left earlobe is very tender to the touch.  She reports that she has been wearing this pair of earrings for the past month without taking them out or cleaning them.  She has tried taking Ibuprofen which has helped somewhat with the pain.    She also suffers from chronic abdominal pain with intermittent nauseaand vomiting which has worsened since having an IUD placed about 3 weeks ago per the patient.  She has a history of giardia treated with Flagyl.  She was seen in the ED on 3/22 with these same complaints and had a normal stool culture, O&P, CMP, U/A, wet prep, urine pregnancy, and GC/Clamydia.  Her nausea comes and goes without any clear trigger.  Her abdominal pain is sharp, crampy and located in the lower abdomen.  She compares the pain to menstrual cramps.  She rates the pain as a 10 out of 10.  If she takes ibuprofen, the pain with subside within about 30 minutes.  She reports that her activities are affected by this pain and she "can't do anything" and "can't move" when she has the pain.  She does have trouble with large hard stools that are hard to pass intermittently.  She occasionally has burning and blood when wiping after bowel movements.  She also describes some intermittent watery stools.  No fevers  ER and specialist records reviewed.  The following portions of the patient's history were reviewed and updated as appropriate: allergies, current medications and problem list.  Physical Exam:  Temp(Src) 98.2 F (36.8 C)  LMP 02/27/2013  No BP reading on file for this encounter. Patient's last menstrual period was 02/27/2013.    General:   alert, cooperative and no distress     Skin:   normal  Oral  cavity:   lips, mucosa, and tongue normal; teeth and gums normal  Eyes:   sclerae white, pupils equal and reactive  Ears:   normal right ear, left ear exam reveals that skin has completely overgrown the back of her earring such that only the end of the stud is visible.  There is no erythema or induration or keloid formation.    Nose: clear, no discharge  Neck:  Neck appearance: Normal  Lungs:  clear to auscultation bilaterally  Heart:   regular rate and rhythm, S1, S2 normal, no murmur, click, rub or gallop   Abdomen:  soft, nondistended.  She is tender to palpation in the periumbilical and suprapubic area.  no masses, No rebound, no guarding.    GU:  not examined  Extremities:   extremities normal, atraumatic, no cyanosis or edema  Neuro:  normal without focal findings    Assessment/Plan:  18 year old female with chronic abdominal pain and now with retained foreign body in the left ear lobe.  Refer to plastics for extraction of foreign body.  Refilled Ibuprofen 600 mg tabs to take at first sign of abdominal cramping.  Recommend she carry this in her purse with some water.  Also discussed treatment options for nausea but she reports that multiple meds tried in the past have been unsuccessful.  Also discussed trying hard candy or lozenges to help with nausea.  Return precautions, and emergency procedures reviewed.  - Immunizations today: none  - Follow-up visit in 1 week for IUD follow-up with Dr. Marina Goodell, or sooner as needed.    Heber [place], MD  04/12/2013

## 2013-04-12 NOTE — Patient Instructions (Signed)
Bring your Ibuprofen with you in your purse and take it at the first sign of belly pain or cramping.  Call our office for a recheck if you develop a fever, persistent vomiting, or are unable to take liquids by mouth.

## 2013-04-13 NOTE — Telephone Encounter (Signed)
Patient was seen in Peds Teaching for an earring lodged in ear lobe and symptoms were discussed.  She was reminded of appointment.

## 2013-04-13 NOTE — Progress Notes (Signed)
Spoke with Debra [name] at  [facility] to schedule an appointment and was told that this procedure is considered cosmetic and Medicaid will not pay for consult or surgery if patient elects.  [facility]  Dr. Jearld FentonByers will see patient on 04/17/13 at 1:00pm.

## 2013-04-21 ENCOUNTER — Ambulatory Visit: Payer: Medicaid Other | Admitting: Pediatrics

## 2013-04-27 ENCOUNTER — Ambulatory Visit (INDEPENDENT_AMBULATORY_CARE_PROVIDER_SITE_OTHER): Payer: Medicaid Other | Admitting: Pediatrics

## 2013-04-27 ENCOUNTER — Encounter: Payer: Self-pay | Admitting: Pediatrics

## 2013-04-27 VITALS — BP 90/68 | Wt 142.8 lb

## 2013-04-27 DIAGNOSIS — Z309 Encounter for contraceptive management, unspecified: Secondary | ICD-10-CM

## 2013-04-27 DIAGNOSIS — Q828 Other specified congenital malformations of skin: Secondary | ICD-10-CM

## 2013-04-27 DIAGNOSIS — L858 Other specified epidermal thickening: Secondary | ICD-10-CM | POA: Insufficient documentation

## 2013-04-27 DIAGNOSIS — R109 Unspecified abdominal pain: Secondary | ICD-10-CM

## 2013-04-27 MED ORDER — TRETINOIN 0.025 % EX CREA: TOPICAL_CREAM | Freq: Every day | CUTANEOUS | Status: DC

## 2013-04-27 NOTE — Patient Instructions (Addendum)
It was great to meet you today!  You can try the retin-A cream for the bumps on your back or arms, I have sent a prescription  PLease continue taking th eBentyl (medicine from GI for your cramps)  Irritable Bowel Syndrome Irritable Bowel Syndrome (IBS) is caused by a disturbance of normal bowel function. Other terms used are spastic colon, mucous colitis, and irritable colon. It does not require surgery, nor does it lead to cancer. There is no cure for IBS. But with proper diet, stress reduction, and medication, you will find that your problems (symptoms) will gradually disappear or improve. IBS is a common digestive disorder. It usually appears in late adolescence or early adulthood. Women develop it twice as often as men. CAUSES  After food has been digested and absorbed in the small intestine, waste material is moved into the colon (large intestine). In the colon, water and salts are absorbed from the undigested products coming from the small intestine. The remaining residue, or fecal material, is held for elimination. Under normal circumstances, gentle, rhythmic contractions on the bowel walls push the fecal material along the colon towards the rectum. In IBS, however, these contractions are irregular and poorly coordinated. The fecal material is either retained too long, resulting in constipation, or expelled too soon, producing diarrhea. SYMPTOMS  The most common symptom of IBS is pain. It is typically in the lower left side of the belly (abdomen). But it may occur anywhere in the abdomen. It can be felt as heartburn, backache, or even as a dull pain in the arms or shoulders. The pain comes from excessive bowel-muscle spasms and from the buildup of gas and fecal material in the colon. This pain:  Can range from sharp belly (abdominal) cramps to a dull, continuous ache.  Usually worsens soon after eating.  Is typically relieved by having a bowel movement or passing gas. Abdominal pain is  usually accompanied by constipation. But it may also produce diarrhea. The diarrhea typically occurs right after a meal or upon arising in the morning. The stools are typically soft and watery. They are often flecked with secretions (mucus). Other symptoms of IBS include:  Bloating.  Loss of appetite.  Heartburn.  Feeling sick to your stomach (nausea).  Belching  Vomiting  Gas. IBS may also cause a number of symptoms that are unrelated to the digestive system:  Fatigue.  Headaches.  Anxiety  Shortness of breath  Difficulty in concentrating.  Dizziness. These symptoms tend to come and go. DIAGNOSIS  The symptoms of IBS closely mimic the symptoms of other, more serious digestive disorders. So your caregiver may wish to perform a variety of additional tests to exclude these disorders. He/she wants to be certain of learning what is wrong (diagnosis). The nature and purpose of each test will be explained to you. TREATMENT A number of medications are available to help correct bowel function and/or relieve bowel spasms and abdominal pain. Among the drugs available are:  Mild, non-irritating laxatives for severe constipation and to help restore normal bowel habits.  Specific anti-diarrheal medications to treat severe or prolonged diarrhea.  Anti-spasmodic agents to relieve intestinal cramps.  Your caregiver may also decide to treat you with a mild tranquilizer or sedative during unusually stressful periods in your life. The important thing to remember is that if any drug is prescribed for you, make sure that you take it exactly as directed. Make sure that your caregiver knows how well it worked for you. HOME CARE INSTRUCTIONS  Avoid foods that are high in fat or oils. Some examples NWG:NFAOZare:heavy cream, butter, frankfurters, sausage, and other fatty meats.  Avoid foods that have a laxative effect, such as fruit, fruit juice, and dairy products.  Cut out carbonated drinks, chewing  gum, and "gassy" foods, such as beans and cabbage. This may help relieve bloating and belching.  Bran taken with plenty of liquids may help relieve constipation.  Keep track of what foods seem to trigger your symptoms.  Avoid emotionally charged situations or circumstances that produce anxiety.  Start or continue exercising.  Get plenty of rest and sleep. MAKE SURE YOU:   Understand these instructions.  Will watch your condition.  Will get help right away if you are not doing well or get worse. Document Released: 12/29/2004 Document Revised: 03/23/2011 Document Reviewed: 08/19/2007 [facility] Patient Information 2014 [place].

## 2013-04-27 NOTE — Progress Notes (Signed)
Attending Physician Co-Signature  I saw and evaluated the patient, performing the key elements of the service.  I developed  the management plan that is described in the resident's note, and I agree with the content.  Rogelio Waynick [place] Nikoletta Varma, MD  

## 2013-04-27 NOTE — Progress Notes (Signed)
Patient ID: Debra [name], female   DOB: [birth year], 18 y.o.   MRN: [id number] History was provided by the patient.  Debra [name] is a 18 y.o. female who is here for IUD insertion f/u.    HPI:    Contraception/menorrhagia Pt states that the skyla placed about 4 weeks ago now has not helped her bleeding. She states that she has had constant vaginal bleeding since last march requiring about 3-4 tampons daily. She has had episodes during this time of heavier bleeding consistent with menstral cycles.   She is currently sexually active with 1 partner, she has had 2 in the last 6 months.   IBS/Abd pain She reports continued 10/10 RLE crampy/pressure like abd pain that she thinks is most related to her anxiety about driving. She describes it as a pain that intermittently radaites to her R flank that does not have alleviating factors. She has approx 2-3 formed stools daily preceded by crampy abd pain sometimes. She has had improvement in her pain with bentyl.   The following portions of the patient's history were reviewed and updated as appropriate: allergies, current medications, past family history, past medical history, past social history, past surgical history and problem list.  Physical Exam:  BP 90/68  Wt 142 lb 12.8 oz (64.774 kg)  No height on file for this encounter. No LMP recorded.    General:   alert, cooperative and no distress     Skin:   normal  Oral cavity:   normal findings: lips normal without lesions  Eyes:   sclerae white  Ears:   not examined  Nose: not examined  Neck:  Neck appearance: Normal  Lungs:  clear to auscultation bilaterally  Heart:   regular rate and rhythm, S1, S2 normal, no murmur, click, rub or gallop   Abdomen:  soft, tenderness to palpation in RLQ, no guarding or rebounding  GU:  not examined  Extremities:   extremities normal, atraumatic, no cyanosis or edema  Neuro:  normal without focal findings and gait and station normal     Assessment/Plan: 1. Contraceptive management - discussed waiting 4 more weeks before removing IUD to see if her vaginal bleeding improves, patient agreeable - Follow up 4 weeks  2. Abdominal pain - Likely IBS related, patient improving with bentyl, encouraged her to continue - has had some success previously with remeron  3. Keratosis pilaris - Discussed amlactan, patient would like trial of retin-A cream - retin a cream Rx written.   - Immunizations today: None  Return in about 2 weeks (around 05/11/2013) for follow up abd pain.   Elenora GammaSamuel L Riyana Biel, MD  04/27/2013

## 2013-05-11 ENCOUNTER — Ambulatory Visit: Payer: Self-pay | Admitting: Pediatrics

## 2013-06-02 ENCOUNTER — Telehealth: Payer: Self-pay | Admitting: Pediatrics

## 2013-06-02 ENCOUNTER — Encounter (HOSPITAL_COMMUNITY): Payer: Self-pay | Admitting: *Deleted

## 2013-06-02 ENCOUNTER — Inpatient Hospital Stay (HOSPITAL_COMMUNITY)
Admission: AD | Admit: 2013-06-02 | Discharge: 2013-06-02 | Disposition: A | Discharge status: Home / Self Care | Payer: Medicaid Other | Source: Ambulatory Visit | Attending: Obstetrics & Gynecology | Admitting: Obstetrics & Gynecology

## 2013-06-02 DIAGNOSIS — T839XXA Unspecified complication of genitourinary prosthetic device, implant and graft, initial encounter: Secondary | ICD-10-CM

## 2013-06-02 DIAGNOSIS — Z30432 Encounter for removal of intrauterine contraceptive device: Secondary | ICD-10-CM | POA: Diagnosis not present

## 2013-06-02 DIAGNOSIS — B3731 Acute candidiasis of vulva and vagina: Secondary | ICD-10-CM

## 2013-06-02 DIAGNOSIS — B373 Candidiasis of vulva and vagina: Secondary | ICD-10-CM | POA: Insufficient documentation

## 2013-06-02 DIAGNOSIS — Z87891 Personal history of nicotine dependence: Secondary | ICD-10-CM | POA: Insufficient documentation

## 2013-06-02 DIAGNOSIS — N926 Irregular menstruation, unspecified: Secondary | ICD-10-CM | POA: Insufficient documentation

## 2013-06-02 DIAGNOSIS — K219 Gastro-esophageal reflux disease without esophagitis: Secondary | ICD-10-CM | POA: Insufficient documentation

## 2013-06-02 DIAGNOSIS — N949 Unspecified condition associated with female genital organs and menstrual cycle: Secondary | ICD-10-CM

## 2013-06-02 LAB — URINALYSIS, ROUTINE W REFLEX MICROSCOPIC
Bilirubin Urine: NEGATIVE
GLUCOSE, UA: NEGATIVE mg/dL
KETONES UR: NEGATIVE mg/dL
Leukocytes, UA: NEGATIVE
NITRITE: NEGATIVE
Protein, ur: NEGATIVE mg/dL
SPECIFIC GRAVITY, URINE: 1.015 (ref 1.005–1.030)
Urobilinogen, UA: 0.2 mg/dL (ref 0.0–1.0)
pH: 7 (ref 5.0–8.0)

## 2013-06-02 LAB — POCT PREGNANCY, URINE: PREG TEST UR: NEGATIVE

## 2013-06-02 LAB — URINE MICROSCOPIC-ADD ON

## 2013-06-02 MED ORDER — OXYCODONE-ACETAMINOPHEN 5-325 MG PO TABS
1.0000 | ORAL_TABLET | Freq: Four times a day (QID) | ORAL | Status: DC | PRN
  Administered 2013-06-02: 1 via ORAL
  Filled 2013-06-02: qty 1

## 2013-06-02 MED ORDER — FLUCONAZOLE 150 MG PO TABS: 150.0000 mg | ORAL_TABLET | Freq: Once | ORAL | Status: DC

## 2013-06-02 NOTE — MAU Note (Signed)
Pt is here for IUD issues.  Pt states she is having constant abd pain since she had the the [facility] IUD placed on 03-21-13 by Dr. Marina Goodell. [facility]).  Pt states she is currently bleeding vaginal and has always had vaginal bleeding since the IUD was placed.  Three days ago she noticed some dark brown clumpy discharge and self treated with some medication for yeast infection but doesn't think it got rid of it.

## 2013-06-02 NOTE — Telephone Encounter (Signed)
Patient called asking if she could be seen today to have her IUD taken out. She said that she was bleeding and I asked Angelique Blonder what we needed to do since Dr.[name] and Andrey Campanile wasn't here, Angelique Blonder and Dr.Ashburn [facility] or [facility] done to see where the IUD is placed. She still insisted on asking if they would take it out at [facility] I told her that I didn't know, but that I would still message Dr.[name] and Andrey Campanile. Thanks.

## 2013-06-02 NOTE — MAU Provider Note (Signed)
Attestation of Attending Supervision of Advanced Practitioner (CNM/NP): Evaluation and management procedures were performed by the Advanced Practitioner under my supervision and collaboration.  I have reviewed the Advanced Practitioner's note and chart, and I agree with the management and plan.  Heloise Gordan Harraway-Smith 7:06 PM

## 2013-06-02 NOTE — Discharge Instructions (Signed)
Contraception Choices Contraception (birth control) is the use of any methods or devices to prevent pregnancy. Below are some methods to help avoid pregnancy. HORMONAL METHODS   Contraceptive implant This is a thin, plastic tube containing progesterone hormone. It does not contain estrogen hormone. Your health care provider inserts the tube in the inner part of the upper arm. The tube can remain in place for up to 3 years. After 3 years, the implant must be removed. The implant prevents the ovaries from releasing an egg (ovulation), thickens the cervical mucus to prevent sperm from entering the uterus, and thins the lining of the inside of the uterus.  Progesterone-only injections These injections are given every 3 months by your health care provider to prevent pregnancy. This synthetic progesterone hormone stops the ovaries from releasing eggs. It also thickens cervical mucus and changes the uterine lining. This makes it harder for sperm to survive in the uterus.  Birth control pills These pills contain estrogen and progesterone hormone. They work by preventing the ovaries from releasing eggs (ovulation). They also cause the cervical mucus to thicken, preventing the sperm from entering the uterus. Birth control pills are prescribed by a health care provider.Birth control pills can also be used to treat heavy periods.  Minipill This type of birth control pill contains only the progesterone hormone. They are taken every day of each month and must be prescribed by your health care provider.  Birth control patch The patch contains hormones similar to those in birth control pills. It must be changed once a week and is prescribed by a health care provider.  Vaginal ring The ring contains hormones similar to those in birth control pills. It is left in the vagina for 3 weeks, removed for 1 week, and then a new one is put back in place. The patient must be comfortable inserting and removing the ring from the  vagina.A health care provider's prescription is necessary.  Emergency contraception Emergency contraceptives prevent pregnancy after unprotected sexual intercourse. This pill can be taken right after sex or up to 5 days after unprotected sex. It is most effective the sooner you take the pills after having sexual intercourse. Most emergency contraceptive pills are available without a prescription. Check with your pharmacist. Do not use emergency contraception as your only form of birth control. BARRIER METHODS   Female condom This is a thin sheath (latex or rubber) that is worn over the penis during sexual intercourse. It can be used with spermicide to increase effectiveness.  Female condom. This is a soft, loose-fitting sheath that is put into the vagina before sexual intercourse.  Diaphragm This is a soft, latex, dome-shaped barrier that must be fitted by a health care provider. It is inserted into the vagina, along with a spermicidal jelly. It is inserted before intercourse. The diaphragm should be left in the vagina for 6 to 8 hours after intercourse.  Cervical cap This is a round, soft, latex or plastic cup that fits over the cervix and must be fitted by a health care provider. The cap can be left in place for up to 48 hours after intercourse.  Sponge This is a soft, circular piece of polyurethane foam. The sponge has spermicide in it. It is inserted into the vagina after wetting it and before sexual intercourse.  Spermicides These are chemicals that kill or block sperm from entering the cervix and uterus. They come in the form of creams, jellies, suppositories, foam, or tablets. They do not require a   prescription. They are inserted into the vagina with an applicator before having sexual intercourse. The process must be repeated every time you have sexual intercourse. INTRAUTERINE CONTRACEPTION  Intrauterine device (IUD) This is a T-shaped device that is put in a woman's uterus during a  menstrual period to prevent pregnancy. There are 2 types:  Copper IUD This type of IUD is wrapped in copper wire and is placed inside the uterus. Copper makes the uterus and fallopian tubes produce a fluid that kills sperm. It can stay in place for 10 years.  Hormone IUD This type of IUD contains the hormone progestin (synthetic progesterone). The hormone thickens the cervical mucus and prevents sperm from entering the uterus, and it also thins the uterine lining to prevent implantation of a fertilized egg. The hormone can weaken or kill the sperm that get into the uterus. It can stay in place for 3 5 years, depending on which type of IUD is used. PERMANENT METHODS OF CONTRACEPTION  Female tubal ligation This is when the woman's fallopian tubes are surgically sealed, tied, or blocked to prevent the egg from traveling to the uterus.  Hysteroscopic sterilization This involves placing a small coil or insert into each fallopian tube. Your doctor uses a technique called hysteroscopy to do the procedure. The device causes scar tissue to form. This results in permanent blockage of the fallopian tubes, so the sperm cannot fertilize the egg. It takes about 3 months after the procedure for the tubes to become blocked. You must use another form of birth control for these 3 months.  Female sterilization This is when the female has the tubes that carry sperm tied off (vasectomy).This blocks sperm from entering the vagina during sexual intercourse. After the procedure, the man can still ejaculate fluid (semen). NATURAL PLANNING METHODS  Natural family planning This is not having sexual intercourse or using a barrier method (condom, diaphragm, cervical cap) on days the woman could become pregnant.  Calendar method This is keeping track of the length of each menstrual cycle and identifying when you are fertile.  Ovulation method This is avoiding sexual intercourse during ovulation.  Symptothermal method This is  avoiding sexual intercourse during ovulation, using a thermometer and ovulation symptoms.  Post ovulation method This is timing sexual intercourse after you have ovulated. Regardless of which type or method of contraception you choose, it is important that you use condoms to protect against the transmission of sexually transmitted infections (STIs). Talk with your health care provider about which form of contraception is most appropriate for you. Document Released: 12/29/2004 Document Revised: 08/31/2012 Document Reviewed: 06/23/2012 [employer] Patient Information 2014 [employer].  
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
## 2013-06-06 NOTE — Telephone Encounter (Signed)
[facility].  Please call patient to ensure she is coming for her f/u on 6/4 and whether she needs to be seen sooner.

## 2013-06-08 NOTE — Telephone Encounter (Signed)
done

## 2013-06-09 ENCOUNTER — Telehealth: Payer: Self-pay

## 2013-06-09 NOTE — Telephone Encounter (Signed)
Called and patient states she feels better and the bleeding has stopped.  Advised her to keep 6/4 appointment.  She asks if she can get a rx for OCP prior to visit and also for Diflucan.  I told her I would send the request to Dr. Marina Goodell and we will call to let her know ASAP.  She verbalized understanding.

## 2013-06-15 ENCOUNTER — Ambulatory Visit (INDEPENDENT_AMBULATORY_CARE_PROVIDER_SITE_OTHER): Payer: Medicaid Other | Admitting: Pediatrics

## 2013-06-15 ENCOUNTER — Encounter: Payer: Self-pay | Admitting: Pediatrics

## 2013-06-15 VITALS — BP 104/72 | Ht 65.67 in | Wt 137.0 lb

## 2013-06-15 DIAGNOSIS — Z309 Encounter for contraceptive management, unspecified: Secondary | ICD-10-CM

## 2013-06-15 DIAGNOSIS — J45909 Unspecified asthma, uncomplicated: Secondary | ICD-10-CM | POA: Diagnosis not present

## 2013-06-15 DIAGNOSIS — F411 Generalized anxiety disorder: Secondary | ICD-10-CM | POA: Diagnosis not present

## 2013-06-15 MED ORDER — BECLOMETHASONE DIPROPIONATE 80 MCG/ACT IN AERS: 2.0000 | INHALATION_SPRAY | Freq: Two times a day (BID) | RESPIRATORY_TRACT | Status: DC | PRN

## 2013-06-15 MED ORDER — CETIRIZINE HCL 10 MG PO TABS: 10.0000 mg | ORAL_TABLET | Freq: Every day | ORAL | Status: DC

## 2013-06-15 MED ORDER — NORELGESTROMIN-ETH ESTRADIOL 150-35 MCG/24HR TD PTWK: 1.0000 | MEDICATED_PATCH | TRANSDERMAL | Status: DC

## 2013-06-15 NOTE — Patient Instructions (Signed)

## 2013-06-15 NOTE — Progress Notes (Signed)
Adolescent Medicine Consultation Follow-Up Visit Debra [name]  is a 18 y.o. female here today for follow-up of abdominal pain, contraceptive management.  PCP Confirmed?  yes  [name], Bosie ClosMARTHA FAIRBANKS, MD   History was provided by the patient.  Chart review:  Last seen by Dr. Marina GoodellPerry on 4/16.  Treatment plan at last visit included continuing use of IUD.   Patient's last menstrual period was 06/03/2013.  Last STI screen/pertinent labs: Seen in ED on 04/02/13 with complaints of abdominal pain and had a normal stool culture, O&P, CMP, U/A, wet prep, urine pregnancy, and GC/Clamydia.  Previous Pysch Screenings: Completed PHQ-SADS on 07/08/12 PHQ-15: 16 GAD-7: 19 PHQ-9: 11.  Trial of Remeron and patient improved signficantly. She stopped taking the Remeron and presented on 01/06/13 with multiple concerns. Advised at that time to consider restarting treatment of anxiety.  Immunizations: UTD  HPI:  18 y/o female with chronic abdominal pain, menorrhagia presenting to discuss contraception and follow-up.    Contraception:  Skyla IUD placed on 03/21/13.  Patient last seen by Dr. Marina GoodellPerry on 4/16, patient complained of abdominal pain and bleeding at that visit and and the plan was to keep the IUD in place at that time. Patient reported worsening abdominal pain, resulting [facility].  Patient reports the IUD was "stuck in her cervix" per [facility]  The IUD was removed on 5/22.  She reports bleeding for 2-3 days after removal, but subsequently had improved abdominal pain, improved mood.  Debra [name] was told she should take two doses of an antifungal for a presumed yeast infection, but there was no prescription available to pick up.    Pt reports she wishes to use a contraceptive device that allows her to keep menstruating.   She has trialed depot, but reported bleeding after discontinuation. She has also tried nuva ring but had difficulties with  insertion. She has been on OCP's but was poorly compliant.  She reports she was younger when on OCP's and will likely be more responsible now.   She reports no vaginal discharge, no odors, no dysuria currently.   She reports having sex three days ago. She reports use of condom at that encounter. She is sexually active with one partner.  He has no known STI's.     Abdominal Pain:  She is having no abdominal pain currently.  She [facility], and reports that improved her abdominal pain.  She reports having 2-3 stools daily that are described as a mix of soft/hard.   Keratosis pilaris: Patient prescribed Retin-A cream at last visit. She reports using it once on her back, but has difficulties with application due to location.  Patient has noted a new area on her right bicep.    Allergies:  Patient reports red eyes, nasal congestion, cough.  She reports improvement of symptoms with zyrtec in the past, but states her prescription is expired.    Asthma:  Patient is prescribed QVAR/Albuterol.  Patient reports symptoms are well-controlled, but she has not been using her QVAR because she lost it.  Adolescent Contact Information: [phone]  Psych Screenings completed for today's visit: PHQ-SADS.   ROS 10 systems reviewed and negative except as stated in HPI.   Current Outpatient Prescriptions on File Prior to Visit  Medication Sig Dispense Refill  . albuterol (PROVENTIL HFA;VENTOLIN HFA) 108 (90 BASE) MCG/ACT inhaler Inhale 1-2 puffs into the lungs every  6 (six) hours as needed for wheezing or shortness of breath.  1 Inhaler  1  . dicyclomine (BENTYL) 10 MG capsule Take 10 mg by mouth 4 (four) times daily.      Marland Kitchen ibuprofen (ADVIL,MOTRIN) 600 MG tablet Take 1 tablet (600 mg total) by mouth every 6 (six) hours as needed for cramping.  30 tablet  0  . tretinoin (RETIN-A) 0.025 % cream Apply topically at bedtime.  45 g  0   No current facility-administered  medications on file prior to visit.    Allergies  Allergen Reactions  . Shellfish Allergy Anaphylaxis    Patient Active Problem List   Diagnosis Date Noted  . Asthma, chronic 06/15/2013  . Keratosis pilaris 04/27/2013  . Screening for STD (sexually transmitted disease) 02/24/2013  . Abdominal pain 01/06/2013  . Right ankle pain 05/31/2012  . Generalized anxiety disorder 05/18/2012  . Allergic rhinitis 05/18/2012  . Recurrent cough 05/18/2012  . Contraceptive management 05/18/2012  . LEUKOPENIA, MILD 03/05/2010  . HYPERLIPIDEMIA, MILD 08/27/2008    Social History: Sleep:  Sleeping 8 hours nightly  Eating Habits: She reports eating twice daily (increased from once daily). She reports trying to eat breakfast.  She eats fruits/vegetables 2-3 times weekly. However, losing weight.  BMI 22, was 23 at last visit.  Screen Time:  < 1 hour/daily  Exercise: Tries to do some exercise daily, squats, abdominal exercises.  School/Work:  Working at [employer], working approximately 40-60 hours/week.    Confidentiality was discussed with the patient and if applicable, with caregiver as well. Tobacco? Smokes 1 black & mild per week. Stopped smoking marijuana completely.   Secondhand smoke exposure?yes, friends.  Drugs/EtOH?yes, drinks occasionally.  Sexually active?yes Pregnancy Prevention: condoms.  Safe at home, in school & in relationships? Yes Safe to self? Yes  Physical Exam:  Filed Vitals:   06/15/13 1057  BP: 104/72  Height: 5' 5.67" (1.668 m)  Weight: 137 lb (62.143 kg)   BP 104/72  Ht 5' 5.67" (1.668 m)  Wt 137 lb (62.143 kg)  BMI 22.34 kg/m2  LMP 06/03/2013 Body mass index: body mass index is 22.34 kg/(m^2). 29.5% systolic and 78.6% diastolic of BP percentile by age, sex, and height. 127/81 is approximately the 95th BP percentile reading. Physical Exam: Filed Vitals:   06/15/13 1057  BP: 104/72   Gen:  Well-appearing, in no in acute distress.  HEENT: Moist mucous  membranes. Oropharynx without exudates, no rhinorrhea.  CV: Regular rate and rhythm, no murmurs rubs or gallops. Cap refill < 3 seconds PULM: Clear to auscultation bilaterally. Normal work of breathing. No wheezes ABD: Soft, non tender, non distended, normal bowel sounds. No HSM EXT: Well perfused SKIN:  Papular lesions present on RUE, hyperpigmented lesions on back present.  Neuro: Alert, normal muscle bulk and tone, sensation intact to light touch  Assessment/Plan: 18 y/o female with chronic abdominal pain, menorrhagia presenting to discuss contraception and follow-up.    Menorrhagia/Contraception:  Patient has not tolerated skyla IUD. Patient wishes to trial ortho evra patch.  -Prescription given for ortho-evra patch with refills -Discussed use of patch and location of placement -Discussed what to do if patch falls off -Discussed importance of condom use to prevent STI's, unwanted pregnancy  Abdominal Pain:  Patient has a chronic history of IBS. Patient's abdominal pain currently improved.  Discussed healthy eating and exercise.   -Will continue to monitor.  -Advised avoidence of chronic laxative use if having regular, soft bowel movements.   Keratosis Pilaris:   -  Discussed use of Retin A as needed -Discussed precautions with sun exposure   Anxiety:  Patient's PHQ-SADS greatly improved.  PHQ-15 was 5.  GAD-7 was 0.  PGQ-9 was 1.    Allergies:   -Prescribed zyrtec 10mg  qhs  Asthma: Currently well-controlled. Has not used her albuterol in several months.   -Prescribed QVAR -Spacer teaching given in the office today.   Follow-Up:  Patient to schedule physical in 6 weeks.    Medical decision-making:  > 45 minutes spent, more than 50% of appointment was spent discussing diagnosis and management of symptoms

## 2013-06-19 ENCOUNTER — Telehealth: Payer: Self-pay

## 2013-06-19 MED ORDER — BECLOMETHASONE DIPROPIONATE 80 MCG/ACT IN AERS: 2.0000 | INHALATION_SPRAY | Freq: Two times a day (BID) | RESPIRATORY_TRACT | Status: DC | PRN

## 2013-06-19 MED ORDER — CETIRIZINE HCL 10 MG PO TABS: 10.0000 mg | ORAL_TABLET | Freq: Every day | ORAL | Status: DC

## 2013-06-19 MED ORDER — NORELGESTROMIN-ETH ESTRADIOL 150-35 MCG/24HR TD PTWK: 1.0000 | MEDICATED_PATCH | TRANSDERMAL | Status: DC

## 2013-06-19 NOTE — Telephone Encounter (Signed)
Resent   thanks

## 2013-06-19 NOTE — Telephone Encounter (Signed)
Patient states medication was not at her pharmacy from last week's visit.   Please send ASAP.

## 2013-06-19 NOTE — Addendum Note (Signed)
Addended by: Delorse Lek F on: 06/19/2013 05:35 PM   Modules accepted: Orders

## 2013-06-19 NOTE — Addendum Note (Signed)
Addended by: Delorse Lek F on: 06/19/2013 03:20 PM   Modules accepted: Orders, Medications

## 2013-06-19 NOTE — Telephone Encounter (Signed)
I assume you mean the prescription for the patch?  It had been sent but I resent just now.

## 2013-06-19 NOTE — Telephone Encounter (Signed)
Qvar and Zyrtec.  Your notes say you prescribed these.

## 2013-06-22 NOTE — Progress Notes (Signed)
Attending Physician Co-Signature  I saw and evaluated the patient, performing the key elements of the service.  I developed  the management plan that is described in the resident's note, and I agree with the content.  Chrissa Meetze FAIRBANKS, MD  

## 2013-06-27 ENCOUNTER — Emergency Department (HOSPITAL_BASED_OUTPATIENT_CLINIC_OR_DEPARTMENT_OTHER)
Admission: EM | Admit: 2013-06-27 | Discharge: 2013-06-27 | Disposition: A | Discharge status: Home / Self Care | Payer: Medicaid Other | Attending: Emergency Medicine | Admitting: Emergency Medicine

## 2013-06-27 ENCOUNTER — Emergency Department (HOSPITAL_BASED_OUTPATIENT_CLINIC_OR_DEPARTMENT_OTHER): Payer: Medicaid Other

## 2013-06-27 ENCOUNTER — Encounter (HOSPITAL_BASED_OUTPATIENT_CLINIC_OR_DEPARTMENT_OTHER): Payer: Self-pay | Admitting: Emergency Medicine

## 2013-06-27 DIAGNOSIS — W208XXA Other cause of strike by thrown, projected or falling object, initial encounter: Secondary | ICD-10-CM | POA: Insufficient documentation

## 2013-06-27 DIAGNOSIS — S61209A Unspecified open wound of unspecified finger without damage to nail, initial encounter: Secondary | ICD-10-CM | POA: Insufficient documentation

## 2013-06-27 DIAGNOSIS — IMO0002 Reserved for concepts with insufficient information to code with codable children: Secondary | ICD-10-CM | POA: Insufficient documentation

## 2013-06-27 DIAGNOSIS — Z79899 Other long term (current) drug therapy: Secondary | ICD-10-CM | POA: Insufficient documentation

## 2013-06-27 DIAGNOSIS — Y9289 Other specified places as the place of occurrence of the external cause: Secondary | ICD-10-CM | POA: Insufficient documentation

## 2013-06-27 DIAGNOSIS — S6000XA Contusion of unspecified finger without damage to nail, initial encounter: Secondary | ICD-10-CM | POA: Insufficient documentation

## 2013-06-27 DIAGNOSIS — Z8619 Personal history of other infectious and parasitic diseases: Secondary | ICD-10-CM | POA: Insufficient documentation

## 2013-06-27 DIAGNOSIS — Z8742 Personal history of other diseases of the female genital tract: Secondary | ICD-10-CM | POA: Insufficient documentation

## 2013-06-27 DIAGNOSIS — Z872 Personal history of diseases of the skin and subcutaneous tissue: Secondary | ICD-10-CM | POA: Insufficient documentation

## 2013-06-27 DIAGNOSIS — Z8669 Personal history of other diseases of the nervous system and sense organs: Secondary | ICD-10-CM | POA: Insufficient documentation

## 2013-06-27 DIAGNOSIS — Y9389 Activity, other specified: Secondary | ICD-10-CM | POA: Insufficient documentation

## 2013-06-27 DIAGNOSIS — Z8719 Personal history of other diseases of the digestive system: Secondary | ICD-10-CM | POA: Insufficient documentation

## 2013-06-27 DIAGNOSIS — T148XXA Other injury of unspecified body region, initial encounter: Secondary | ICD-10-CM

## 2013-06-27 DIAGNOSIS — F17200 Nicotine dependence, unspecified, uncomplicated: Secondary | ICD-10-CM | POA: Insufficient documentation

## 2013-06-27 MED ORDER — IBUPROFEN 400 MG PO TABS
600.0000 mg | ORAL_TABLET | Freq: Once | ORAL | Status: AC
  Administered 2013-06-27: 600 mg via ORAL
  Filled 2013-06-27 (×2): qty 1

## 2013-06-27 MED ORDER — IBUPROFEN 400 MG PO TABS: 400.0000 mg | ORAL_TABLET | Freq: Four times a day (QID) | ORAL | Status: DC | PRN

## 2013-06-27 MED ORDER — HYDROCODONE-ACETAMINOPHEN 5-325 MG PO TABS
1.0000 | ORAL_TABLET | Freq: Once | ORAL | Status: AC
  Administered 2013-06-27: 1 via ORAL
  Filled 2013-06-27: qty 1

## 2013-06-27 MED ORDER — HYDROCODONE-ACETAMINOPHEN 5-325 MG PO TABS: 1.0000 | ORAL_TABLET | Freq: Four times a day (QID) | ORAL | Status: DC | PRN

## 2013-06-27 NOTE — Discharge Instructions (Signed)
Please see the hand doctor as requested for the tendon injury evaluation. Please keep the splint on until Orthopedic appointment.   Contusion A contusion is a deep bruise. Contusions are the result of an injury that caused bleeding under the skin. The contusion may turn blue, purple, or yellow. Minor injuries will give you a painless contusion, but more severe contusions may stay painful and swollen for a few weeks.  CAUSES  A contusion is usually caused by a blow, trauma, or direct force to an area of the body. SYMPTOMS   Swelling and redness of the injured area.  Bruising of the injured area.  Tenderness and soreness of the injured area.  Pain. DIAGNOSIS  The diagnosis can be made by taking a history and physical exam. An X-ray, CT scan, or MRI may be needed to determine if there were any associated injuries, such as fractures. TREATMENT  Specific treatment will depend on what area of the body was injured. In general, the best treatment for a contusion is resting, icing, elevating, and applying cold compresses to the injured area. Over-the-counter medicines may also be recommended for pain control. Ask your caregiver what the best treatment is for your contusion. HOME CARE INSTRUCTIONS   Put ice on the injured area.  Put ice in a plastic bag.  Place a towel between your skin and the bag.  Leave the ice on for 15-20 minutes, 03-04 times a day.  Only take over-the-counter or prescription medicines for pain, discomfort, or fever as directed by your caregiver. Your caregiver may recommend avoiding anti-inflammatory medicines (aspirin, ibuprofen, and naproxen) for 48 hours because these medicines may increase bruising.  Rest the injured area.  If possible, elevate the injured area to reduce swelling. SEEK IMMEDIATE MEDICAL CARE IF:   You have increased bruising or swelling.  You have pain that is getting worse.  Your swelling or pain is not relieved with medicines. MAKE SURE  YOU:   Understand these instructions.  Will watch your condition.  Will get help right away if you are not doing well or get worse. Document Released: 10/08/2004 Document Revised: 03/23/2011 Document Reviewed: 11/03/2010 [facility] Patient Information 2014 [employer] [place] [place]. Fingertip Injuries and Amputations Fingertip injuries are common and often get injured because they are last to escape when pulling your hand out of harm's way. You have amputated (cut off) part of your finger. How this turns out depends largely on how much was amputated. If just the tip is amputated, often the end of the finger will grow back and the finger may return to much the same as it was before the injury.  If more of the finger is missing, your caregiver has done the best with the tissue remaining to allow you to keep as much finger as is possible. Your caregiver after checking your injury has tried to leave you with a painless fingertip that has durable, feeling skin. If possible, your caregiver has tried to maintain the finger's length and appearance and preserve its fingernail.  Please read the instructions outlined below and refer to this sheet in the next few weeks. These instructions provide you with general information on caring for yourself. Your caregiver may also give you specific instructions. While your treatment has been done according to the most current medical practices available, unavoidable complications occasionally occur. If you have any problems or questions after discharge, please call your caregiver. HOME CARE INSTRUCTIONS   You may resume normal diet and activities as directed or allowed.  Keep  your hand elevated above the level of your heart. This helps decrease pain and swelling.  Keep ice packs (or a bag of ice wrapped in a towel) on the injured area for 15-20 minutes, 03-04 times per day, for the first two days.  Change dressings if necessary or as directed.  Clean the wound daily or as  directed.  Only take over-the-counter or prescription medicines for pain, discomfort, or fever as directed by your caregiver.  Keep appointments as directed. SEEK IMMEDIATE MEDICAL CARE IF:  You develop redness, swelling, numbness or increasing pain in the wound.  There is pus coming from the wound.  You develop an unexplained oral temperature above 102 F (38.9 C) or as your caregiver suggests.  There is a foul (bad) smell coming from the wound or dressing.  There is a breaking open of the wound (edges not staying together) after sutures or staples have been removed. MAKE SURE YOU:   Understand these instructions.  Will watch your condition.  Will get help right away if you are not doing well or get worse. Document Released: 11/19/2004 Document Revised: 03/23/2011 Document Reviewed: 10/19/2007 [facility] Patient Information 2014 [place] [place].
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
## 2013-06-27 NOTE — ED Notes (Signed)
Pt with injury to right pinky finger nail, box fell on it at work and split her nail

## 2013-06-30 ENCOUNTER — Ambulatory Visit: Payer: Medicaid Other

## 2013-07-25 ENCOUNTER — Ambulatory Visit: Payer: Self-pay | Admitting: Pediatrics

## 2013-07-26 ENCOUNTER — Ambulatory Visit: Payer: Self-pay | Admitting: Pediatrics

## 2013-09-19 ENCOUNTER — Encounter: Payer: Self-pay | Admitting: Pediatrics

## 2013-09-19 ENCOUNTER — Ambulatory Visit (INDEPENDENT_AMBULATORY_CARE_PROVIDER_SITE_OTHER): Payer: Medicaid Other | Admitting: Pediatrics

## 2013-09-19 ENCOUNTER — Other Ambulatory Visit: Payer: Self-pay | Admitting: Pediatrics

## 2013-09-19 VITALS — BP 116/70 | Ht 65.83 in | Wt 147.4 lb

## 2013-09-19 DIAGNOSIS — Z3202 Encounter for pregnancy test, result negative: Secondary | ICD-10-CM

## 2013-09-19 DIAGNOSIS — Z0289 Encounter for other administrative examinations: Secondary | ICD-10-CM

## 2013-09-19 DIAGNOSIS — Z113 Encounter for screening for infections with a predominantly sexual mode of transmission: Secondary | ICD-10-CM

## 2013-09-19 DIAGNOSIS — IMO0002 Reserved for concepts with insufficient information to code with codable children: Secondary | ICD-10-CM

## 2013-09-19 LAB — POCT URINE PREGNANCY: Preg Test, Ur: NEGATIVE

## 2013-09-19 MED ORDER — FAMOTIDINE 20 MG PO TABS: 20.0000 mg | ORAL_TABLET | Freq: Two times a day (BID) | ORAL | Status: DC | PRN

## 2013-09-19 MED ORDER — BECLOMETHASONE DIPROPIONATE 80 MCG/ACT IN AERS: 2.0000 | INHALATION_SPRAY | Freq: Two times a day (BID) | RESPIRATORY_TRACT | Status: DC | PRN

## 2013-09-19 MED ORDER — CETIRIZINE HCL 10 MG PO TABS: 10.0000 mg | ORAL_TABLET | Freq: Every day | ORAL | Status: DC

## 2013-09-19 NOTE — Patient Instructions (Signed)
Remember to use the spacer every time you use your Qvar inhaler or albuterol inhaler.   Remember to get a new Qvar inhaler every 30 days because the medicine runs out in 30 days. You should rinse your mouth out after you use Qvar to prevent thrush.

## 2013-09-19 NOTE — Progress Notes (Signed)
Attending Co-Signature.  I saw and evaluated the patient, performing the key elements of the service.  I developed the management plan that is described in the resident's note, and I agree with the content.  RHCM:  BMI wnl, Hearing/Vision wnl, BP wnl, Imms UTD, STI screening ordered today, lipid screening ordered today, vit D not indicated, behavioral health screening wnl, TB risk none.  Cain Sieve, MD Adolescent Medicine Specialist

## 2013-09-19 NOTE — Progress Notes (Addendum)
Adolescent Medicine Physical Exam  Debra [name]  is a 18 y.o. female with a history of Persistent asthma, Chronic abdominal pain, anxiety and menorrhagia who comes to the clinic for annual physical exam.      PCP:  Cain Sieve, MD   History was provided by the patient.  Last STI screen: 04/02/13 Immunizations: UTD Psych screenings completed for today's visit: PHQ-9: 2, marked "1" for trouble sleeping and poor appetite RAAPS: Positive for tobacco, alcohol, marijuana, sex and attracted to same sex   HPI:  Pt reports that she has overall been feeling well. Her abdominal pain did resolve after removal of the IUD, however it recurred 2 days ago. She says the pain is 7/10 in the entire abdomen. She cannot describe the pain, other than to say that the pain makes her feel like she needs to vomit. She vomited 2-3x yesterday, non-bloody. She has been able to tolerate food and liquids. She has a normal BM every day. She denies fevers, rash, diarrhea, sick contacts.   Asthma:  Uses Qvar once daily at night, however she does not have a spacer and she has been using the same inhaler for several months.  Uses Albuterol 3-4x per week - for exercise, heat and when she is excited Coughs every night  Patient's last menstrual period was 09/12/2013.  Review of Systems  Constitutional: Negative for fever.  Respiratory: Positive for cough.   Cardiovascular: Negative for chest pain.  Gastrointestinal: Positive for nausea, vomiting and abdominal pain. Negative for diarrhea, constipation and blood in stool.  Skin: Negative for rash.  All other systems reviewed and are negative.    The following portions of the patient's history were reviewed and updated as appropriate: allergies, current medications, past medical history, past social history, past surgical history and problem list.  Allergies  Allergen Reactions  . Shellfish Allergy Anaphylaxis    Past Medical History:  Asthma, Chronic  abdominal pain, menorrhagia, anxiety  Social History: Recently got a new job working for [employer].  She had previously stopped smoking marijuana, but restarted smoking a few days ago to celebrate her new job. She smokes once per day but plans to stop once her job starts.  States that she does not smoke tobacco.  Drinks wine 1x per week. Never drinks more than 4 drinks at once.  Has sex with 1 female partner, does not use protection.   Physical Exam:  Filed Vitals:   09/19/13 1541  BP: 116/70  Height: 5' 5.83" (1.672 m)  Weight: 147 lb 6.4 oz (66.86 kg)   BP 116/70  Ht 5' 5.83" (1.672 m)  Wt 147 lb 6.4 oz (66.86 kg)  BMI 23.92 kg/m2  LMP 09/12/2013 Body mass index: body mass index is 23.92 kg/(m^2). Facility age limit for growth percentiles is 20 years.  GEN: Pleasant girl sitting up on the exam table in no acute distress.  HEENT: Conjunctiva clear. Oropharynx moist with no erythema, edema or exudate. Neck supple with no cervical lymphadenopathy. Normal thyroid.  CV: Regular rate and rhythm. No murmurs, rubs or gallops. Normal radial pulses.  CHEST: Breast exam normal bilaterally RESP: Normal work of breathing. Lungs clear to auscultation bilaterally with no wheezes or crackles.  GI: Normal bowel sounds. Abdomen soft, non-distended. Pain to palpation in the RLQ with no guarding. Able to move around in the room normally.   Assessment/Plan:  Persistent asthma: currently not controlled, however using Qvar without spacer and with old inhaler - Nursing gave spacer and did teaching  with patient's inhaler and spacer - Refilled Qvar and told patient to replace the inhaler every month to ensure she is getting the medication  Routine healthcare maintenance: - Sent Lipid panel today - last lipid panel Aug 2010 - Sent STI screening: urine Gonorrhea, Chlamydia and Trichomonas, serum HIV  Reflux - Refilled prescription for Famotidine per patient's request  Allergies - Refilled prescription  for Cetirizine per patient's request  Follow-up:  3 months to follow-up on asthma

## 2013-09-20 LAB — HIV ANTIBODY (ROUTINE TESTING W REFLEX): HIV: NONREACTIVE

## 2013-09-20 LAB — GC/CHLAMYDIA PROBE AMP
CT Probe RNA: NEGATIVE
GC Probe RNA: NEGATIVE

## 2013-09-23 ENCOUNTER — Encounter (HOSPITAL_COMMUNITY): Payer: Self-pay | Admitting: Emergency Medicine

## 2013-09-23 ENCOUNTER — Emergency Department (HOSPITAL_COMMUNITY)
Admission: EM | Admit: 2013-09-23 | Discharge: 2013-09-23 | Disposition: A | Discharge status: Home / Self Care | Payer: Medicaid Other | Attending: Emergency Medicine | Admitting: Emergency Medicine

## 2013-09-23 ENCOUNTER — Emergency Department (HOSPITAL_COMMUNITY): Payer: Medicaid Other

## 2013-09-23 DIAGNOSIS — Z79899 Other long term (current) drug therapy: Secondary | ICD-10-CM | POA: Insufficient documentation

## 2013-09-23 DIAGNOSIS — K219 Gastro-esophageal reflux disease without esophagitis: Secondary | ICD-10-CM | POA: Insufficient documentation

## 2013-09-23 DIAGNOSIS — F17200 Nicotine dependence, unspecified, uncomplicated: Secondary | ICD-10-CM | POA: Insufficient documentation

## 2013-09-23 DIAGNOSIS — Z3202 Encounter for pregnancy test, result negative: Secondary | ICD-10-CM | POA: Diagnosis not present

## 2013-09-23 DIAGNOSIS — Z872 Personal history of diseases of the skin and subcutaneous tissue: Secondary | ICD-10-CM | POA: Insufficient documentation

## 2013-09-23 DIAGNOSIS — Z862 Personal history of diseases of the blood and blood-forming organs and certain disorders involving the immune mechanism: Secondary | ICD-10-CM | POA: Insufficient documentation

## 2013-09-23 DIAGNOSIS — Z8739 Personal history of other diseases of the musculoskeletal system and connective tissue: Secondary | ICD-10-CM | POA: Diagnosis not present

## 2013-09-23 DIAGNOSIS — R1033 Periumbilical pain: Secondary | ICD-10-CM | POA: Insufficient documentation

## 2013-09-23 DIAGNOSIS — Z8619 Personal history of other infectious and parasitic diseases: Secondary | ICD-10-CM | POA: Insufficient documentation

## 2013-09-23 HISTORY — DX: Nausea with vomiting, unspecified: R11.2

## 2013-09-23 HISTORY — DX: Unspecified abdominal pain: R10.9

## 2013-09-23 HISTORY — DX: Other chronic pain: G89.29

## 2013-09-23 LAB — CBC WITH DIFFERENTIAL/PLATELET
Basophils Absolute: 0 10*3/uL (ref 0.0–0.1)
Basophils Relative: 1 % (ref 0–1)
Eosinophils Absolute: 0 10*3/uL (ref 0.0–0.7)
Eosinophils Relative: 0 % (ref 0–5)
HEMATOCRIT: 39.9 % (ref 36.0–46.0)
Hemoglobin: 14.2 g/dL (ref 12.0–15.0)
Lymphocytes Relative: 40 % (ref 12–46)
Lymphs Abs: 1.9 10*3/uL (ref 0.7–4.0)
MCH: 29.2 pg (ref 26.0–34.0)
MCHC: 35.6 g/dL (ref 30.0–36.0)
MCV: 82.1 fL (ref 78.0–100.0)
MONOS PCT: 5 % (ref 3–12)
Monocytes Absolute: 0.3 10*3/uL (ref 0.1–1.0)
NEUTROS ABS: 2.7 10*3/uL (ref 1.7–7.7)
Neutrophils Relative %: 54 % (ref 43–77)
Platelets: 316 10*3/uL (ref 150–400)
RBC: 4.86 MIL/uL (ref 3.87–5.11)
RDW: 12.5 % (ref 11.5–15.5)
WBC: 4.9 10*3/uL (ref 4.0–10.5)

## 2013-09-23 LAB — PREGNANCY, URINE: Preg Test, Ur: NEGATIVE

## 2013-09-23 LAB — COMPREHENSIVE METABOLIC PANEL
ALK PHOS: 70 U/L (ref 39–117)
ALT: 17 U/L (ref 0–35)
AST: 17 U/L (ref 0–37)
Albumin: 4.4 g/dL (ref 3.5–5.2)
Anion gap: 13 (ref 5–15)
BUN: 5 mg/dL — ABNORMAL LOW (ref 6–23)
CO2: 23 mEq/L (ref 19–32)
Calcium: 9.8 mg/dL (ref 8.4–10.5)
Chloride: 101 mEq/L (ref 96–112)
Creatinine, Ser: 0.67 mg/dL (ref 0.50–1.10)
GFR calc Af Amer: >90 mL/min (ref >90)
GFR calc non Af Amer: >90 mL/min (ref >90)
GLUCOSE: 84 mg/dL (ref 70–99)
Potassium: 4 mEq/L (ref 3.7–5.3)
Sodium: 137 mEq/L (ref 137–147)
TOTAL PROTEIN: 8.1 g/dL (ref 6.0–8.3)
Total Bilirubin: 0.4 mg/dL (ref 0.3–1.2)

## 2013-09-23 LAB — URINALYSIS, ROUTINE W REFLEX MICROSCOPIC
Bilirubin Urine: NEGATIVE
GLUCOSE, UA: NEGATIVE mg/dL
HGB URINE DIPSTICK: NEGATIVE
Ketones, ur: NEGATIVE mg/dL
LEUKOCYTES UA: NEGATIVE
Nitrite: NEGATIVE
PROTEIN: NEGATIVE mg/dL
SPECIFIC GRAVITY, URINE: 1.014 (ref 1.005–1.030)
Urobilinogen, UA: 0.2 mg/dL (ref 0.0–1.0)
pH: 7 (ref 5.0–8.0)

## 2013-09-23 LAB — WET PREP, GENITAL
CLUE CELLS WET PREP: NONE SEEN
Trich, Wet Prep: NONE SEEN
Yeast Wet Prep HPF POC: NONE SEEN

## 2013-09-23 LAB — LIPASE, BLOOD: Lipase: 34 U/L (ref 11–59)

## 2013-09-23 MED ORDER — IOHEXOL 300 MG/ML  SOLN: 25.0000 mL | INTRAMUSCULAR | Status: DC | PRN

## 2013-09-23 MED ORDER — IOHEXOL 300 MG/ML  SOLN: 25.0000 mL | INTRAMUSCULAR | Status: AC

## 2013-09-23 MED ORDER — IOHEXOL 300 MG/ML  SOLN
100.0000 mL | Freq: Once | INTRAMUSCULAR | Status: AC | PRN
  Administered 2013-09-23: 100 mL via INTRAVENOUS

## 2013-09-23 NOTE — ED Notes (Signed)
CT contacted to make aware that patient has finished contrast.
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
## 2013-09-23 NOTE — Progress Notes (Addendum)
Patient completed PO contrast- Upreg collected since 25 - Upreg in process @ 1448

## 2013-09-23 NOTE — ED Notes (Signed)
Lab contacted about pregnancy test.

## 2013-09-23 NOTE — Discharge Instructions (Signed)

## 2013-09-23 NOTE — ED Notes (Signed)
Pt. Stated, Debra [name] been having sharpe pain really bad for 4 days I went to see Dr. Marina Goodell and she said it could be a number of things to keep taking  ? Unable to read.  This morning I had a looking like pus on my boo boo (feces)

## 2013-09-23 NOTE — ED Notes (Signed)
Pt ambulated to restroom. 

## 2013-09-25 LAB — GC/CHLAMYDIA PROBE AMP
CT PROBE, AMP APTIMA: NEGATIVE
GC PROBE AMP APTIMA: NEGATIVE

## 2013-09-26 ENCOUNTER — Telehealth: Payer: Self-pay | Admitting: Pediatrics

## 2013-09-26 DIAGNOSIS — R1084 Generalized abdominal pain: Secondary | ICD-10-CM

## 2013-09-26 LAB — OVA AND PARASITE EXAMINATION: OVA AND PARASITES: NONE SEEN

## 2013-09-26 LAB — TRICHOMONAS VAGINALIS, PROBE AMP: TRICHOMONAS VAGINALIS PROBE APTIMA: NEGATIVE

## 2013-09-26 NOTE — Telephone Encounter (Signed)
Debra [name] called today around 3:51pm asking to speak to Debra [name]. Debra [name] would like Debra [name] to give her a call back as soon as possible. Debra [name] wanted to know her test results.

## 2013-09-27 NOTE — ED Provider Notes (Signed)
Medical screening examination/treatment/procedure(s) were performed by non-physician practitioner and as supervising physician I was immediately available for consultation/collaboration.   EKG Interpretation None        Samuel Jester, DO 09/27/13 [phone]

## 2013-10-03 NOTE — Addendum Note (Signed)
Addended by: Delorse Lek F on: 10/03/2013 02:50 PM   Modules accepted: Orders

## 2013-10-03 NOTE — Telephone Encounter (Addendum)
Called patient and she reports she was able to review her results on my chart.  She did have an ER visit recently because she had a stool that had a white paste-like substance on top of it.  Nonbloody.  Evaluated in ED and all testing was negative.  Advised pt may need to return to her GI doctor.  Pt reports she would like to see a GI doctor but would like to be referred to a different doctor this time.  She reports not have a good rapport with her previous GI doctor.  Pt also reports she did an Aveeno facial yesterday and her face became swollen and irritated afterwards.  She took Benadryl which helped some. She wanted to know if there is anything else she can use to help decrease the irritation.  Advised she can continue benadryl and also use hydrocortisone cream on some areas.

## 2013-10-26 ENCOUNTER — Encounter (HOSPITAL_COMMUNITY): Payer: Self-pay | Admitting: Family Medicine

## 2013-10-26 ENCOUNTER — Emergency Department (INDEPENDENT_AMBULATORY_CARE_PROVIDER_SITE_OTHER)
Admission: EM | Admit: 2013-10-26 | Discharge: 2013-10-26 | Disposition: A | Discharge status: Home / Self Care | Payer: Medicaid Other | Source: Home / Self Care | Attending: Family Medicine | Admitting: Family Medicine

## 2013-10-26 DIAGNOSIS — J45901 Unspecified asthma with (acute) exacerbation: Secondary | ICD-10-CM

## 2013-10-26 DIAGNOSIS — R0781 Pleurodynia: Secondary | ICD-10-CM

## 2013-10-26 DIAGNOSIS — R110 Nausea: Secondary | ICD-10-CM

## 2013-10-26 DIAGNOSIS — H6093 Unspecified otitis externa, bilateral: Secondary | ICD-10-CM

## 2013-10-26 LAB — POCT URINALYSIS DIP (DEVICE)
BILIRUBIN URINE: NEGATIVE
GLUCOSE, UA: NEGATIVE mg/dL
HGB URINE DIPSTICK: NEGATIVE
Ketones, ur: 40 mg/dL — AB
Leukocytes, UA: NEGATIVE
NITRITE: NEGATIVE
Protein, ur: NEGATIVE mg/dL
Specific Gravity, Urine: 1.02 (ref 1.005–1.030)
Urobilinogen, UA: 0.2 mg/dL (ref 0.0–1.0)
pH: 7 (ref 5.0–8.0)

## 2013-10-26 LAB — POCT PREGNANCY, URINE: Preg Test, Ur: NEGATIVE

## 2013-10-26 MED ORDER — METHYLPREDNISOLONE SODIUM SUCC 125 MG IJ SOLR
62.5000 mg | Freq: Once | INTRAMUSCULAR | Status: AC
  Administered 2013-10-26: 62.5 mg via INTRAMUSCULAR

## 2013-10-26 MED ORDER — ANTIPYRINE-BENZOCAINE 5.4-1.4 % OT SOLN: 3.0000 [drp] | Freq: Once | OTIC | Status: DC

## 2013-10-26 MED ORDER — METHYLPREDNISOLONE SODIUM SUCC 125 MG IJ SOLR: 62.5000 mg | Freq: Once | INTRAMUSCULAR | Status: DC

## 2013-10-26 MED ORDER — PREDNISONE 50 MG PO TABS: ORAL_TABLET | ORAL | Status: DC

## 2013-10-26 MED ORDER — METHYLPREDNISOLONE SODIUM SUCC 125 MG IJ SOLR
INTRAMUSCULAR | Status: AC
  Filled 2013-10-26: qty 2

## 2013-10-26 MED ORDER — PREDNISONE 20 MG PO TABS: 50.0000 mg | ORAL_TABLET | Freq: Once | ORAL | Status: DC

## 2013-10-26 MED ORDER — CIPROFLOXACIN-DEXAMETHASONE 0.3-0.1 % OT SUSP: 4.0000 [drp] | Freq: Two times a day (BID) | OTIC | Status: DC

## 2013-10-26 NOTE — Discharge Instructions (Signed)
Your chest pain is likely from muscle soreness and bronchospasm from coughing and your asthma flaring Please take the prednisone daily with breakfast Please start the ciprodex for the ear infection Please continue using your albuterol every 4 hours as needed for your asthma for the next 24-48 hours Please use the zofran and phenergan for the nausea.  If you have worsening diarrhea you can consider using imodium but do so sparingly.
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
## 2013-10-26 NOTE — ED Notes (Signed)
Multiple complaints : onset of symptoms Monday: nausea, diarrhea, loss of appetite, chills, hot flashes.  Tuesday: right ear ache, stiff neck.  Wednesday: dry cough, sore throat, and headache.  Thursday: diarrhea continues and epigastric pain has started.  Initially epigastric discomfort was pressure with sharp intervals associated with cough or clearing of throat.  Now pain is constant

## 2013-10-27 ENCOUNTER — Encounter: Payer: Self-pay | Admitting: Pediatrics

## 2013-10-27 ENCOUNTER — Ambulatory Visit
Admission: RE | Admit: 2013-10-27 | Discharge: 2013-10-27 | Disposition: A | Discharge status: Home / Self Care | Payer: Medicaid Other | Source: Ambulatory Visit | Attending: Pediatrics | Admitting: Pediatrics

## 2013-10-27 ENCOUNTER — Ambulatory Visit (INDEPENDENT_AMBULATORY_CARE_PROVIDER_SITE_OTHER): Payer: 59 | Admitting: Pediatrics

## 2013-10-27 VITALS — BP 108/64 | HR 80 | Temp 97.8°F | Resp 20 | Wt 151.0 lb

## 2013-10-27 DIAGNOSIS — J4531 Mild persistent asthma with (acute) exacerbation: Secondary | ICD-10-CM | POA: Diagnosis not present

## 2013-10-27 DIAGNOSIS — R071 Chest pain on breathing: Secondary | ICD-10-CM

## 2013-10-27 DIAGNOSIS — H6093 Unspecified otitis externa, bilateral: Secondary | ICD-10-CM | POA: Diagnosis not present

## 2013-10-27 DIAGNOSIS — R0602 Shortness of breath: Secondary | ICD-10-CM

## 2013-10-27 MED ORDER — ANTIPYRINE-BENZOCAINE 5.4-1.4 % OT SOLN: 3.0000 [drp] | OTIC | Status: DC | PRN
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
## 2013-10-27 NOTE — Patient Instructions (Addendum)
Please continue taking the ear drops as prescribed. We will also prescribe a drop you can use as needed for ear pain.  For your chest pain, you may take ibuprofen as needed. Please continue to use your albuterol inhaler if you need it. Please also continue to use your QVAR twice a day as prescribed. You may continue to take the prednisone (steroids) that you were prescribed as well.  We will get an X-ray of your chest today and contact you with the results.  If your pain or breathing worsens, please seek medical attention.  For your abdominal pain and nausea, please continue your home medications. If these worsen, please seek medical attention.  Please consider getting a flu shot as well.    .Asthma, Acute Bronchospasm Acute bronchospasm caused by asthma is also referred to as an asthma attack. Bronchospasm means your air passages become narrowed. The narrowing is caused by inflammation and tightening of the muscles in the air tubes (bronchi) in your lungs. This can make it hard to breathe or cause you to wheeze and cough. CAUSES Possible triggers are:  Animal dander from the skin, hair, or feathers of animals.  Dust mites contained in house dust.  Cockroaches.  Pollen from trees or grass.  Mold.  Cigarette or tobacco smoke.  Air pollutants such as dust, household cleaners, hair sprays, aerosol sprays, paint fumes, strong chemicals, or strong odors.  Cold air or weather changes. Cold air may trigger inflammation. Winds increase molds and pollens in the air.  Strong emotions such as crying or laughing hard.  Stress.  Certain medicines such as aspirin or beta-blockers.  Sulfites in foods and drinks, such as dried fruits and wine.  Infections or inflammatory conditions, such as a flu, cold, or inflammation of the nasal membranes (rhinitis).  Gastroesophageal reflux disease (GERD). GERD is a condition where stomach acid backs up into your esophagus.  Exercise or strenuous  activity. SIGNS AND SYMPTOMS   Wheezing.  Excessive coughing, particularly at night.  Chest tightness.  Shortness of breath. DIAGNOSIS  Your health care provider will ask you about your medical history and perform a physical exam. A chest X-ray or blood testing may be performed to look for other causes of your symptoms or other conditions that may have triggered your asthma attack. TREATMENT  Treatment is aimed at reducing inflammation and opening up the airways in your lungs. Most asthma attacks are treated with inhaled medicines. These include quick relief or rescue medicines (such as bronchodilators) and controller medicines (such as inhaled corticosteroids). These medicines are sometimes given through an inhaler or a nebulizer. Systemic steroid medicine taken by mouth or given through an IV tube also can be used to reduce the inflammation when an attack is moderate or severe. Antibiotic medicines are only used if a bacterial infection is present.  HOME CARE INSTRUCTIONS   Rest.  Drink plenty of liquids. This helps the mucus to remain thin and be easily coughed up. Only use caffeine in moderation and do not use alcohol until you have recovered from your illness.  Do not smoke. Avoid being exposed to secondhand smoke.  You play a critical role in keeping yourself in good health. Avoid exposure to things that cause you to wheeze or to have breathing problems.  Keep your medicines up-to-date and available. Carefully follow your health care provider's treatment plan.  Take your medicine exactly as prescribed.  When pollen or pollution is bad, keep windows closed and use an air conditioner or go  to places with air conditioning.  Asthma requires careful medical care. See your health care provider for a follow-up as advised. If you are more than [redacted] weeks pregnant and you were prescribed any new medicines, let your obstetrician know about the visit and how you are doing. Follow up with  your health care provider as directed.  After you have recovered from your asthma attack, make an appointment with your outpatient doctor to talk about ways to reduce the likelihood of future attacks. If you do not have a doctor who manages your asthma, make an appointment with a primary care doctor to discuss your asthma. SEEK IMMEDIATE MEDICAL CARE IF:   You are getting worse.  You have trouble breathing. If severe, call your local emergency services (911 in the [place]  You develop chest pain or discomfort.  You are vomiting.  You are not able to keep fluids down.  You are coughing up yellow, green, brown, or bloody sputum.  You have a fever and your symptoms suddenly get worse.  You have trouble swallowing. MAKE SURE YOU:   Understand these instructions.  Will watch your condition.  Will get help right away if you are not doing well or get worse. Document Released: 04/15/2006 Document Revised: 01/03/2013 Document Reviewed: 07/06/2012 [facility] Patient Information 2015 [place], [place]. This information is not intended to replace advice given to you by your health care provider. Make sure you discuss any questions you have with your health care provider.

## 2013-10-27 NOTE — Progress Notes (Deleted)
Subjective:     Patient ID: Debra [name], female   DOB: 06/09/1995, 18 y.o.   MRN: [id number]  HPI   Review of Systems     Objective:   Physical Exam     Assessment:     ***    Plan:     ***

## 2013-10-30 ENCOUNTER — Encounter: Payer: Self-pay | Admitting: Pediatrics

## 2013-10-30 NOTE — Progress Notes (Signed)
Spoke to patient regarding FMLA paperwork she left at front desk to be filled out. According to the patient she needs this filled out related to her upper respiratory infection she was diagnosed with on Friday. She was also asking for more information related to the URI wanted to know more specifically what it was. I explained that there are many different types of viruses and no specific one was tested for but that she should continue to improve over 7-10 days. She did say she is feeling better since Friday. Continuing to use albuterol q 2-3 hours. She was concerned because she gets these symptoms once a year around this time. Educated on viruses and season change related to having asthma. She has been missing a lot of work related to this recent illness (likely why she was given FMLA paperwork). Advised I would pass along this information to Dr. Marina GoodellPerry related to the forms.

## 2013-11-10 ENCOUNTER — Telehealth: Payer: Self-pay | Admitting: Pediatrics

## 2013-11-10 NOTE — Telephone Encounter (Signed)
Debra [name] called this afternoon around 3:20pm. She stated that earlier this week her workplace faxed over some paperwork that needed to be signed by Dr. Marina GoodellPerry and she was calling to see if we had received the paperwork and whether or not Dr. Marina GoodellPerry signed the paperwork. Debra [name] would like us to call her back as soon as we can.

## 2013-11-24 NOTE — Telephone Encounter (Signed)
Information was completed and faxed per instructions from patient.

## 2013-12-22 ENCOUNTER — Encounter: Payer: Self-pay | Admitting: Pediatrics

## 2013-12-22 ENCOUNTER — Ambulatory Visit (INDEPENDENT_AMBULATORY_CARE_PROVIDER_SITE_OTHER): Payer: Medicaid Other | Admitting: Pediatrics

## 2013-12-22 VITALS — BP 110/62 | Ht 65.24 in | Wt 150.2 lb

## 2013-12-22 DIAGNOSIS — Z91018 Allergy to other foods: Secondary | ICD-10-CM

## 2013-12-22 DIAGNOSIS — L700 Acne vulgaris: Secondary | ICD-10-CM | POA: Insufficient documentation

## 2013-12-22 DIAGNOSIS — R6884 Jaw pain: Secondary | ICD-10-CM

## 2013-12-22 DIAGNOSIS — J4530 Mild persistent asthma, uncomplicated: Secondary | ICD-10-CM

## 2013-12-22 DIAGNOSIS — Z13220 Encounter for screening for lipoid disorders: Secondary | ICD-10-CM

## 2013-12-22 HISTORY — DX: Jaw pain: R68.84

## 2013-12-22 MED ORDER — EPINEPHRINE 0.3 MG/0.3ML IJ SOAJ: 0.3000 mg | Freq: Once | INTRAMUSCULAR | Status: DC

## 2013-12-22 NOTE — Progress Notes (Signed)
Attending Co-Signature.  I saw and evaluated the patient, performing the key elements of the service.  I developed the management plan that is described in the resident's note, and I agree with the content. 18 yo female here for follow-up of multiple issues including asthma, allergies, GI problems (IBS), anxiety, recurrent ER visits.  Asthma is improved, s/p flare in October.  Taking QVAR, only at night.  Using albuterol inhaler 3 tiimes per week, mostly with physical activity.  Stopped birth control patch because she thought it caused increased acne.  Not interested in any method at this point.  Reports GI symptoms have resolved and declines GI referral.  Today complains of left jaw pain and occasional locking.  Seeing an orthodontist who did not find any trigger.  No known teeth grinding.  Lots of debris in both ears.  Painful to be examined.  Was treated for OE with ciprodex.  Pt reports intermittent depressed mood, no suicidality.  Interested in self-help.  Gave depressoin management book. - increase QVAR twice daily or at the very least increase dose at night - use albuterol in advance of exercise - refill epi-pen - suspect teeth grinding, recommend mouth guard - use debrox for her ear wax in left ear but suspect ear pain is related to her jaw pain and not OE as canal was clear other than the wax - discuss transition to adult care at next visit if ear/jaw issues resolved  Galia Rahm, Bosie ClosMARTHA FAIRBANKS, MD [facility]

## 2013-12-22 NOTE — Progress Notes (Signed)
Pre-Visit Planning  Previous Psych Screenings:   PHQ-9: 2, marked "1" for trouble sleeping and poor appetite RAAPS: Positive for tobacco, alcohol, marijuana, sex and attracted to same sex   Psych Screenings Due: PHQSADs  Review of previous notes:  [facility].  Treatment plan at last visit included continue QVAR, continue famotidine, cont cetirizine. Seen in ER 09/23/13 for abdominal pain and mucousy stool, work-up was negative including CT scan of abdomen.  Spoke with patient after that visit and advised return to GI specialist.  Pt asked for referral to different GI doctor due to poor rapport with previous GI doctor.  Pt also completed of facial rash and swelling after using a new facial treatment.  Advised to use benadryl and HCT cream and return if not improving.  Pt seen again in ER 10/26/13 for asthma exacerbation.  Received IM solumedrol and albuterol and ear drops for OE.  [facility], continue QVAR and albuterol.  Cont OE treatment.  Reviewed abdominal pain which was improving. Spoke with patient in office 11/24/13 to complete short term disability paperwork because of multiple absences associated with asthma exacerbation.  Last CPE: 09/19/13  Last STI screen:  Component     Latest Ref Rng 09/23/2013  CT Probe RNA     NEGATIVE NEGATIVE  GC Probe RNA     NEGATIVE NEGATIVE   Pertinent Labs: Reviewed multiples labs due to multiple ER visits all wnl.  Immunizations Due: FLU  To Do at visit:   - PHQSADs - Review asthma - Review GI issues and ensure new referral to GI in place - Review ER usage - Review anxiety management - Consider BH consultation - Lipid screening listed as ordered at CPE 09/19/13 but not in chart so needs to be done

## 2013-12-22 NOTE — Progress Notes (Signed)
3:56 PM  Adolescent Medicine Consultation Follow-Up Visit Debra [name]  is a 18 y.o.Debra [name] female here today for follow-up of asthma, birth control and IBS.   PCP Confirmed?  yes  No primary care provider on file.   History was provided by the patient.  Previous Psych Screenings:  PHQ-9: 2, marked "1" for trouble sleeping and poor appetite RAAPS: Positive for tobacco, alcohol, marijuana, sex and attracted to same sex   Psych Screenings Due: PHQSADs  Review of previous notes:  Last seen in [facility] on 09/19/13. Treatment plan at last visit included continue QVAR, continue famotidine, cont cetirizine. Seen in ER 09/23/13 for abdominal pain and mucousy stool, work-up was negative including CT scan of abdomen. Spoke with patient after that visit and advised return to GI specialist. Pt asked for referral to different GI doctor due to poor rapport with previous GI doctor. Pt also completed of facial rash and swelling after using a new facial treatment. Advised to use benadryl and HCT cream and return if not improving. Pt seen again in ER 10/26/13 for asthma exacerbation. Received IM solumedrol and albuterol and ear drops for OE. [facility], neg CXR, continue QVAR and albuterol. Cont OE treatment. Reviewed abdominal pain which was improving. Spoke with patient in office 11/24/13 to complete short term disability paperwork because of multiple absences associated with asthma exacerbation.  Last CPE: 09/19/13  Last STI screen:  Component  Latest Ref Rng 09/23/2013  CT Probe RNA  NEGATIVE NEGATIVE  GC Probe RNA  NEGATIVE NEGATIVE   Pertinent Labs: Reviewed multiples labs due to multiple ER visits all wnl.  Immunizations Due: FLU  To Do at visit:  - PHQSADs - Review asthma - Review GI issues and ensure new referral to GI in place - Review ER usage - Review anxiety management - Consider BH consultation - Lipid screening listed as  ordered at CPE 09/19/13 but not in chart so needs to be done   Growth Chart Viewed? not applicable  HPI:  Asthma: Patient reports improvement in symptoms lately. Last flare was in October. Since then she has been taking QVAR, 2 puffs every night with a spacer. Denies missing doses. Reports nighttime cough 3x/month. Still needing to use albuterol every 2-3 days. Mostly with activity. Albuterol does help to resolve symptoms. Also has additional acute symptom inhaler (yellow with orange top) of unknown medicine that she uses for very bad symptoms.  Allergies: Has a shellfish allergy. Epipen expired 2 years ago so needs new one. Can't remember the last time she had to use it.  Birth control: Has been on the patch but thinks she might want to stop. When she went for a recent facial she was told her acne was hormonal and she thinks the patch might be contributing. She wonders if another type of birth control might work better. She has not been using the patch for the past 2 weeks and hasn't been having any issues. She is concerned that if she stops birth control all together, she will have irregular cycles again. Not currently concerned about pregnancy, has female partner. Has previously tried Depo, OCPs, Nuva ring, and IUD.  GI: Was seen in the ED a few months ago and had discussed referral to new GI doctor. However, she reports recently she has had minimal symptoms. No abdominal pain, vomiting, diarrhea now. She does better if she avoids certain trigger foods. No longer feels like she needs to see GI at this time.  Jaw pain: For the past week has been having left sided jaw pain. Jaw feels stiff in AM and has pain with opening mouth too wide. Sometimes hurts to chew. Hears a pop when opening mouth too wide. Pain feels like it is mostly in the joint but sometimes extends down into the jaw itself. Has tried Ibuprofen and Naproxen with no effect. Not sure if grinds her teeth and partner not sure either.  Did  recently note a chipped tooth on left side and not sure how that happened. Was recently seen by the orthodontist 3 weeks ago because is considering braces and no issues noted. No fevers, no pain in teeth. Worried about tetanus.   Ears: Reports she complete drops for otitis externa. Still having some itching in left ear and occasional pain.  Anxiety/Depression: Reports that she feels depressed about 2-3 days per week. Not sure what provokes it. Just wakes up depressed. Feels sad. Not crying. No thoughts of self-harm. Partner notes that she has trouble making friends and relies heavily on partner and mother for support. Debra Reeksauua thinks this is because she has trouble trusting people. She also feels like she gets angry sometimes and wants to avoid getting mad at people by not making friends.  PHQ-SADS Completed on: 12/22/13 PHQ-15:  3 GAD-7:  3 PHQ-9:  2 Reported problems make it not at all difficult to complete activities of daily functioning.   Patient's last menstrual period was 12/11/2013.  ROS:  Negative except as detailed in HPI.  The following portions of the patient's history were reviewed and updated as appropriate: allergies, current medications, past medical history, past surgical history and problem list.  Allergies  Allergen Reactions  . Shellfish Allergy Anaphylaxis    Social History: Sleep:  No problems. Eating Habits: Pretty good eater. Exercise: Wants to start exercising again. Planning to start today. School/work: Works at [employer]  Confidentiality was discussed with the patient and if applicable, with caregiver as well.  Tobacco? no Secondhand smoke exposure?yes Drugs/EtOH?yes, drinks alcohol 1x/month. Drinks 1 drink per occasion. No drugs. Sexually active? Reports is not sexually active. Lives with female partner. Pregnancy Prevention: Has been on patch but not using. See above. Safe at home, in school & in relationships? Yes Safe to self? Yes  Physical Exam:   Filed Vitals:   12/22/13 1542  BP: 110/62  Height: 5' 5.24" (1.657 m)  Weight: 150 lb 3.2 oz (68.13 kg)   BP 110/62 mmHg  Ht 5' 5.24" (1.657 m)  Wt 150 lb 3.2 oz (68.13 kg)  BMI 24.81 kg/m2  LMP 12/11/2013 Body mass index: body mass index is 24.81 kg/(m^2). Facility age limit for growth percentiles is 20 years.  Physical Exam  Constitutional: She is oriented to person, place, and time. She appears well-developed and well-nourished. No distress.  HENT:  NCAT. Pain with ear exam, left canal obscured by wax, right canal clear. OP clear with MMM. No swelling along jaw. No cavities. No tenderness along jaw but does have pain with opening mouth and audible click.  Eyes: Conjunctivae are normal. Pupils are equal, round, and reactive to light.  Neck: Neck supple.  Cardiovascular: Normal rate, regular rhythm, normal heart sounds and intact distal pulses.   Pulmonary/Chest: Effort normal and breath sounds normal.  Abdominal: Soft. Bowel sounds are normal. She exhibits no distension and no mass. There is tenderness (has tenderness in RLQ with some guarding. States is chronic). There is guarding (mild guarding on palpation of RLQ). There is no rebound.  Musculoskeletal: Normal range of motion. She exhibits no edema.  Lymphadenopathy:    She has no cervical adenopathy.  Neurological: She is alert and oriented to person, place, and time.  Skin: Skin is warm. No rash noted.    Assessment/Plan: 18 yo F with h/o asthma, allergies, anxiety, and likely IBS.  1. 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 - Recommended trial of mouth guard, Advil for pain, warm compress. - Reassured about tetanus.  2. Asthma, chronic, mild persistent, uncomplicated - Improved but still needing frequent albuterol. - Will increase QVAR to 2 puffs BID.  3. Food allergy - Prescribed new EpiPen  4. Screening for lipid disorders - Lipid panel   5. Anxiety/Depression - Provided resources on  coping. - Not interested in counseling at this time.  6. Abdominal discomfort, likely IBS. - Not having many symptoms now. - Will defer GI referral at this time.  7. Birth control - No longer taking patch. - Discussed options and reasons to continue on birth control, primarily acne control and cycle management. - Corayma unsure at this time. Considering OCPs.  8. Ear pain - No signs of OE on exam. May be referred pain from jaw. - Advised Debrox for wax. - Will recheck in 3 weeks.  Follow-up:  3 weeks

## 2013-12-22 NOTE — Patient Instructions (Addendum)
-   Use a sports mouth guard for your jaw pain.   - Take advil (up to 600 mg - 3 tablets) every 6 hours as needed.   - Try a warm washcloth on your jaw to relieve pain and relax the muscles around the jaw. - Use Debrox in your left ear to clear out the wax until you return for a recheck in 2 weeks. - Try some of the strategies in the Depression Booklet  Teeth Grinding (Bruxism) Many people grind or clench their teeth during the day or night. Most are unaware they are doing it. Teeth grinding may be triggered by teeth being improperly aligned (malocclusion). Other causes may be:  Stress.  Worry.  Sleep disorders. Teeth grinding sometimes causes muscle spasms and headaches. Most often, these headaches are classified as tension type. Teeth grinding may also lead to:  Jaw problems (temporomandibular joint disorder [TMJ]).  Facial pain.  Damaged teeth. Teeth grinding is quite common in children, usually without pain. Most of the time it will disappear by adolescence. TREATMENT  Seek dental care if you or your child has:  Pain.  Trouble when opening the mouth.  Damage to teeth. A dentist often can ease those symptoms by fitting a small splint or mouth guard (appliance) to the upper or lower teeth. It is usually worn during sleep. Medications and other stress reduction techniques are sometimes used. Document Released: 01/01/2003 Document Revised: 03/23/2011 Document Reviewed: 03/27/2009 [facility] Patient Information 2015 [place], [place]. This information is not intended to replace advice given to you by your health care provider. Make sure you discuss any questions you have with your health care provider.

## 2013-12-23 LAB — LIPID PANEL
CHOL/HDL RATIO: 3.3 ratio
Cholesterol: 221 mg/dL — ABNORMAL HIGH (ref 0–200)
HDL: 66 mg/dL (ref >39)
LDL Cholesterol: 134 mg/dL — ABNORMAL HIGH (ref 0–99)
TRIGLYCERIDES: 103 mg/dL (ref <150)
VLDL: 21 mg/dL (ref 0–40)

## 2014-01-04 ENCOUNTER — Encounter: Payer: Self-pay | Admitting: Pediatrics

## 2014-01-26 ENCOUNTER — Encounter: Payer: Self-pay | Admitting: Pediatrics

## 2014-01-26 ENCOUNTER — Ambulatory Visit (INDEPENDENT_AMBULATORY_CARE_PROVIDER_SITE_OTHER): Payer: 59 | Admitting: Pediatrics

## 2014-01-26 VITALS — BP 110/68 | Wt 148.8 lb

## 2014-01-26 DIAGNOSIS — Z30011 Encounter for initial prescription of contraceptive pills: Secondary | ICD-10-CM

## 2014-01-26 DIAGNOSIS — E785 Hyperlipidemia, unspecified: Secondary | ICD-10-CM

## 2014-01-26 DIAGNOSIS — L700 Acne vulgaris: Secondary | ICD-10-CM | POA: Diagnosis not present

## 2014-01-26 MED ORDER — NORETHIN ACE-ETH ESTRAD-FE 1.5-30 MG-MCG PO TABS
1.0000 | ORAL_TABLET | Freq: Every day | ORAL | Status: DC
Start: 2014-01-26 — End: 2014-07-29

## 2014-01-26 MED ORDER — TRETINOIN 0.025 % EX CREA: TOPICAL_CREAM | Freq: Every day | CUTANEOUS | Status: DC

## 2014-01-26 NOTE — Progress Notes (Signed)
Pre-Visit Planning  Review of previous notes:  Last seen in [facility] Medicine Clinic on 12/22/13.  Treatment plan at last visit included discussion regarding TMJ, trial of mouth guard, advil for pain, warm compresses, refilled QVAR, refilled epi-pen, reviewed birth control options and patient decided to think about OCPs, reviewed ear pain likely due to her TMJ, reviewed anxiety and patient declined referral for counseling or other interventions.   Previous Psych Screenings?  yes,  PHQ-SADS Completed on: 12/22/13 PHQ-15: 3 GAD-7: 3 PHQ-9: 2 Reported problems make it not at all difficult to complete activities of daily functioning.  STI screen in the past year? yes Pertinent Labs? yes,  Component     Latest Ref Rng 12/22/2013  Cholesterol     0 - 200 mg/dL 161221 (H)  Triglycerides     <150 mg/dL 096103  HDL     >04>39 mg/dL 66  Total CHOL/HDL Ratio      3.3  VLDL     0 - 40 mg/dL 21  LDL (calc)     0 - 99 mg/dL 540134 (H)   Immunizations Due? yes, flu shot  To Do at visit:   - Psych Screenings Due? no - review lipid panel - refer to a new PCP  Adolescent Medicine Consultation Follow-Up Visit Debra [name]  is a 19 y.o. female here today for follow-up of multiple issues.   PCP Confirmed?  No PCP currently, will refer to family medicine  No primary care provider on file.   History was provided by the patient.  Previsit planning completed:  yes  Growth Chart Viewed? not applicable  HPI:  Pt reports that her jaw pain and ear pain have resolved.  Only concern relates to her retinA cream, she would like a refill She would also like to restart the birth control pill, for her acne No migraine with aura No FHx of blood clots She has been on OCPs previously and was able to verbalized side effects as well as strategies for missed pills.  Patient's last menstrual period was 01/12/2014.  The following portions of the patient's history were reviewed and updated as appropriate:  current medications.  Allergies  Allergen Reactions  . Shellfish Allergy Anaphylaxis   Physical Exam:  Filed Vitals:   01/26/14 1334  BP: 110/68  Weight: 148 lb 12.8 oz (67.495 kg)   BP 110/68 mmHg  Wt 148 lb 12.8 oz (67.495 kg)  LMP 01/12/2014 Body mass index: body mass index is 24.58 kg/(m^2). Facility age limit for growth percentiles is 20 years.  Physical Exam  Constitutional: She appears well-nourished. No distress.  Psychiatric: She has a normal mood and affect.   Assessment/Plan: 1. Acne vulgaris - tretinoin (RETIN-A) 0.025 % cream; Apply topically at bedtime.  Dispense: 45 g; Refill: 3 - norethindrone-ethinyl estradiol-iron (JUNEL FE 1.5/30) 1.5-30 MG-MCG tablet; Take 1 tablet by mouth daily.  Dispense: 1 Package; Refill: 11  2. Hyperlipidemia Reviewed importance of diet and exercise  3. Encounter for initial prescription of contraceptive pills  Reviewed side effects, risks and benefits of OCPs.       Follow-up:  PRN until established with adult care physician  Medical decision-making:  > 15 minutes spent, more than 50% of appointment was spent discussing diagnosis and management of symptoms

## 2014-01-26 NOTE — Patient Instructions (Addendum)
Fat and Cholesterol Control Diet  Your diet has an affect on your fat and cholesterol levels in your blood and organs. Too much fat and cholesterol in your blood can affect your:  · Heart.  · Blood vessels (arteries, veins).  · Gallbladder.  · Liver.  · Pancreas.  CONTROL FAT AND CHOLESTEROL WITH DIET  Certain foods raise cholesterol and others lower it. It is important to replace bad fats with other types of fat.   Do not eat:  · Fatty meats, such as hot dogs and salami.  · Stick margarine and some tub margarines that have "partially hydrogenated oils" in them.  · Baked goods, such as cookies and crackers that have "partially hydrogenated oils" in them.  · Saturated tropical oils, such as coconut and palm oil.  Eat the following foods:  · Round or loin cuts of red meat.  · Chicken (without skin).  · Fish.  · Veal.  · Ground turkey breast.  · Shellfish.  · Fruit, such as apples.  · Vegetables, such as broccoli, potatoes, and carrots.  · Beans, peas, and lentils (legumes).  · Grains, such as barley, rice, couscous, and bulgar wheat.  · Pasta (without cream sauces).  Look for foods that are nonfat, low in fat, and low in cholesterol.   FIND FOODS THAT ARE LOWER IN FAT AND CHOLESTEROL  · Find foods with soluble fiber and plant sterols (phytosterol). You should eat 2 grams a day of these foods. These foods include:  ¨ Fruits.  ¨ Vegetables.  ¨ Whole grains.  ¨ Dried beans and peas.  ¨ Nuts and seeds.  · Read package labels. Look for low-saturated fats, trans fat free, low-fat foods.  ¨ Choose cheese that have only 2 to 3 grams of saturated fat per ounce.  ¨ Use heart-healthy tub margarine that is free of trans fat or partially hydrogenated oil.  · Avoid buying baked goods that have partially hydrogenated oils in them. Instead, buy baked goods made with whole grains (whole-wheat or whole oat flour). Avoid baked goods labeled with "flour" or "enriched flour."  · Buy non-creamy canned soups with reduced salt and no added  fats.  PREPARING YOUR FOOD  · Broil, bake, steam, or roast foods. Do not fry food.  · Use non-stick cooking sprays.  · Use lemon or herbs to flavor food instead of using butter or stick margarine.  · Use nonfat yogurt, salsa, or low-fat dressings for salads.  LOW-SATURATED FAT / LOW-FAT FOOD SUBSTITUTES   Meats / Saturated Fat (g)  · Avoid: Steak, marbled (3 oz/85 g) / 11 g.  · Choose: Steak, lean (3 oz/85 g) / 4 g.  · Avoid: Hamburger (3 oz/85 g) / 7 g.  · Choose: Hamburger, lean (3 oz/85 g) / 5 g.  · Avoid: Ham (3 oz/85 g) / 6 g.  · Choose: Ham, lean cut (3 oz/85 g) / 2.4 g.  · Avoid: Chicken, with skin, dark meat (3 oz/85 g) / 4 g.  · Choose: Chicken, skin removed, dark meat (3 oz/85 g) / 2 g.  · Avoid: Chicken, with skin, light meat (3 oz/85 g) / 2.5 g.  · Choose: Chicken, skin removed, light meat (3 oz/85 g) / 1 g.  Dairy / Saturated Fat (g)  · Avoid: Whole milk (1 cup) / 5 g.  · Choose: Low-fat milk, 2% (1 cup) / 3 g.  · Choose: Low-fat milk, 1% (1 cup) / 1.5 g.  · Choose: Skim milk (  1 cup) / 0.3 g.  · Avoid: Hard cheese (1 oz/28 g) / 6 g.  · Choose: Skim milk cheese (1 oz/28 g) / 2 to 3 g.  · Avoid: Cottage cheese, 4% fat (1 cup) / 6.5 g.  · Choose: Low-fat cottage cheese, 1% fat (1 cup) / 1.5 g.  · Avoid: Ice cream (1 cup) / 9 g.  · Choose: Sherbet (1 cup) / 2.5 g.  · Choose: Nonfat frozen yogurt (1 cup) / 0.3 g.  · Choose: Frozen fruit bar / trace.  · Avoid: Whipped cream (1 tbs) / 3.5 g.  · Choose: Nondairy whipped topping (1 tbs) / 1 g.  Condiments / Saturated Fat (g)  · Avoid: Mayonnaise (1 tbs) / 2 g.  · Choose: Low-fat mayonnaise (1 tbs) / 1 g.  · Avoid: Butter (1 tbs) / 7 g.  · Choose: Extra light margarine (1 tbs) / 1 g.  · Avoid: Coconut oil (1 tbs) / 11.8 g.  · Choose: Olive oil (1 tbs) / 1.8 g.  · Choose: Corn oil (1 tbs) / 1.7 g.  · Choose: Safflower oil (1 tbs) / 1.2 g.  · Choose: Sunflower oil (1 tbs) / 1.4 g.  · Choose: Soybean oil (1 tbs) / 2.4 g .  · Choose: Canola oil (1 tbs) / 1  g.  Document Released: 06/30/2011 Document Revised: 08/31/2012 Document Reviewed: 03/30/2013  ExitCare® Patient Information ©2015 [employer]. This information is not intended to replace advice given to you by your health care provider. Make sure you discuss any questions you have with your health care provider.

## 2014-01-26 NOTE — Progress Notes (Signed)
Pre-Visit Planning  Review of previous notes:  Last seen in [facility] Clinic on 12/22/13.  Treatment plan at last visit included discussion regarding TMJ, trial of mouth guard, advil for pain, warm compresses, refilled QVAR, refilled epi-pen, reviewed birth control options and patient decided to think about OCPs, reviewed ear pain likely due to her TMJ, reviewed anxiety and patient declined referral for counseling or other interventions.   Previous Psych Screenings?  yes,  PHQ-SADS Completed on: 12/22/13 PHQ-15: 3 GAD-7: 3 PHQ-9: 2 Reported problems make it not at all difficult to complete activities of daily functioning.  STI screen in the past year? yes Pertinent Labs? yes,  Component     Latest Ref Rng 12/22/2013  Cholesterol     0 - 200 mg/dL 295221 (H)  Triglycerides     <150 mg/dL 621103  HDL     >30>39 mg/dL 66  Total CHOL/HDL Ratio      3.3  VLDL     0 - 40 mg/dL 21  LDL (calc)     0 - 99 mg/dL 865134 (H)   Immunizations Due? yes, flu shot  To Do at visit:   - Psych Screenings Due? no - review lipid panel - refer to a new PCP

## 2014-01-29 ENCOUNTER — Encounter (HOSPITAL_COMMUNITY): Payer: Self-pay | Admitting: Family Medicine

## 2014-01-29 ENCOUNTER — Emergency Department (HOSPITAL_COMMUNITY)
Admission: EM | Admit: 2014-01-29 | Discharge: 2014-01-29 | Disposition: A | Discharge status: Home / Self Care | Payer: 59 | Attending: Emergency Medicine | Admitting: Emergency Medicine

## 2014-01-29 DIAGNOSIS — K219 Gastro-esophageal reflux disease without esophagitis: Secondary | ICD-10-CM | POA: Insufficient documentation

## 2014-01-29 DIAGNOSIS — Z8739 Personal history of other diseases of the musculoskeletal system and connective tissue: Secondary | ICD-10-CM | POA: Insufficient documentation

## 2014-01-29 DIAGNOSIS — R21 Rash and other nonspecific skin eruption: Secondary | ICD-10-CM | POA: Insufficient documentation

## 2014-01-29 DIAGNOSIS — G8929 Other chronic pain: Secondary | ICD-10-CM | POA: Insufficient documentation

## 2014-01-29 DIAGNOSIS — Z8619 Personal history of other infectious and parasitic diseases: Secondary | ICD-10-CM | POA: Diagnosis not present

## 2014-01-29 DIAGNOSIS — Z79899 Other long term (current) drug therapy: Secondary | ICD-10-CM | POA: Diagnosis not present

## 2014-01-29 DIAGNOSIS — Z87891 Personal history of nicotine dependence: Secondary | ICD-10-CM | POA: Insufficient documentation

## 2014-01-29 DIAGNOSIS — L509 Urticaria, unspecified: Secondary | ICD-10-CM | POA: Insufficient documentation

## 2014-01-29 DIAGNOSIS — Z862 Personal history of diseases of the blood and blood-forming organs and certain disorders involving the immune mechanism: Secondary | ICD-10-CM | POA: Insufficient documentation

## 2014-01-29 MED ORDER — FAMOTIDINE 20 MG PO TABS
20.0000 mg | ORAL_TABLET | Freq: Once | ORAL | Status: AC
  Administered 2014-01-29: 20 mg via ORAL
  Filled 2014-01-29: qty 1

## 2014-01-29 MED ORDER — DIPHENHYDRAMINE HCL 25 MG PO CAPS
25.0000 mg | ORAL_CAPSULE | Freq: Once | ORAL | Status: AC
  Administered 2014-01-29: 25 mg via ORAL
  Filled 2014-01-29: qty 1

## 2014-01-29 MED ORDER — PREDNISONE 20 MG PO TABS
60.0000 mg | ORAL_TABLET | Freq: Once | ORAL | Status: AC
  Administered 2014-01-29: 60 mg via ORAL
  Filled 2014-01-29: qty 3

## 2014-01-29 MED ORDER — FAMOTIDINE 20 MG PO TABS: 20.0000 mg | ORAL_TABLET | Freq: Two times a day (BID) | ORAL | Status: DC

## 2014-01-29 MED ORDER — PREDNISONE 20 MG PO TABS: 60.0000 mg | ORAL_TABLET | Freq: Every day | ORAL | Status: DC
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
## 2014-01-29 NOTE — ED Notes (Signed)
Pt made aware to return if symptoms worsen or if any life threatening symptoms occur.   

## 2014-01-29 NOTE — ED Notes (Addendum)
Per pt sts rash that started since Friday. Rash noted to face arms, chest. sts her throat feels scratchy and tight. Denies any new meds, lotions, foods.

## 2014-01-29 NOTE — ED Notes (Signed)
Pt reports rash since Friday on face, right arm and chest.  Pt reports moving into a new apartment recently.  Pt also reports using a new hair product over the past couple of days, but pt reports the product she used before have the same ingredients.  Pt reports her throat hurting, no redness or swelling noted. Pt alert and oriented.

## 2014-03-11 ENCOUNTER — Encounter: Payer: Self-pay | Admitting: Pediatrics

## 2014-03-25 ENCOUNTER — Telehealth: Payer: Self-pay | Admitting: Pediatrics

## 2014-03-25 NOTE — Telephone Encounter (Signed)
Please call patient and notify her that I sent a mychart message.  She has not yet read the message.  I was writing to check in about her care transition to family medicine.  Please ask her where she is with that process and if there is anything we can do to assist her.

## 2014-03-26 NOTE — Telephone Encounter (Signed)
Called  patient and notified  her that Dr. Marina GoodellPerry sent a mychart message and that she has not yet read the message. Pt informed that Dr. Marina GoodellPerry was writing to check in about her care transition to family medicine.Callback number provided for further support.

## 2014-05-06 ENCOUNTER — Encounter (HOSPITAL_COMMUNITY): Payer: Self-pay | Admitting: *Deleted

## 2014-05-06 ENCOUNTER — Emergency Department (HOSPITAL_COMMUNITY)
Admission: EM | Admit: 2014-05-06 | Discharge: 2014-05-06 | Disposition: A | Discharge status: Home / Self Care | Payer: 59 | Attending: Emergency Medicine | Admitting: Emergency Medicine

## 2014-05-06 DIAGNOSIS — Z7952 Long term (current) use of systemic steroids: Secondary | ICD-10-CM | POA: Insufficient documentation

## 2014-05-06 DIAGNOSIS — Z87828 Personal history of other (healed) physical injury and trauma: Secondary | ICD-10-CM | POA: Diagnosis not present

## 2014-05-06 DIAGNOSIS — Z79899 Other long term (current) drug therapy: Secondary | ICD-10-CM | POA: Diagnosis not present

## 2014-05-06 DIAGNOSIS — Z862 Personal history of diseases of the blood and blood-forming organs and certain disorders involving the immune mechanism: Secondary | ICD-10-CM | POA: Diagnosis not present

## 2014-05-06 DIAGNOSIS — Z7951 Long term (current) use of inhaled steroids: Secondary | ICD-10-CM | POA: Insufficient documentation

## 2014-05-06 DIAGNOSIS — Z87891 Personal history of nicotine dependence: Secondary | ICD-10-CM | POA: Diagnosis not present

## 2014-05-06 DIAGNOSIS — M436 Torticollis: Secondary | ICD-10-CM | POA: Diagnosis not present

## 2014-05-06 DIAGNOSIS — Z8619 Personal history of other infectious and parasitic diseases: Secondary | ICD-10-CM | POA: Diagnosis not present

## 2014-05-06 DIAGNOSIS — Z872 Personal history of diseases of the skin and subcutaneous tissue: Secondary | ICD-10-CM | POA: Insufficient documentation

## 2014-05-06 DIAGNOSIS — Z8719 Personal history of other diseases of the digestive system: Secondary | ICD-10-CM | POA: Diagnosis not present

## 2014-05-06 DIAGNOSIS — M542 Cervicalgia: Secondary | ICD-10-CM | POA: Diagnosis present

## 2014-05-06 DIAGNOSIS — M419 Scoliosis, unspecified: Secondary | ICD-10-CM | POA: Diagnosis not present

## 2014-05-06 DIAGNOSIS — M62838 Other muscle spasm: Secondary | ICD-10-CM | POA: Diagnosis not present

## 2014-05-06 DIAGNOSIS — Z8742 Personal history of other diseases of the female genital tract: Secondary | ICD-10-CM | POA: Diagnosis not present

## 2014-05-06 MED ORDER — METHOCARBAMOL 500 MG PO TABS: 500.0000 mg | ORAL_TABLET | Freq: Three times a day (TID) | ORAL | Status: DC | PRN

## 2014-05-06 MED ORDER — NAPROXEN 500 MG PO TABS: 500.0000 mg | ORAL_TABLET | Freq: Two times a day (BID) | ORAL | Status: DC | PRN

## 2014-05-06 NOTE — ED Notes (Signed)
Pt c/o left sided nexk pain since last Sunday. States the has taken ibuprofen and muscle relaxer's with little relief.

## 2014-05-06 NOTE — Discharge Instructions (Signed)
Take naprosyn as directed for inflammation and robaxin for muscle relaxation. Do not drive or operate machinery with muscle relaxation use. Use heat to areas of soreness and provide gentle massage to the area. Follow up with sports medicine clinic recheck of ongoing symptoms. Return to ER for emergent changing or worsening of symptoms.     Muscle Cramps and Spasms Muscle cramps and spasms occur when a muscle or muscles tighten and you have no control over this tightening (involuntary muscle contraction). They are a common problem and can develop in any muscle. The most common place is in the calf muscles of the leg. Both muscle cramps and muscle spasms are involuntary muscle contractions, but they also have differences:   Muscle cramps are sporadic and painful. They may last a few seconds to a quarter of an hour. Muscle cramps are often more forceful and last longer than muscle spasms.  Muscle spasms may or may not be painful. They may also last just a few seconds or much longer. CAUSES  It is uncommon for cramps or spasms to be due to a serious underlying problem. In many cases, the cause of cramps or spasms is unknown. Some common causes are:   Overexertion.   Overuse from repetitive motions (doing the same thing over and over).   Remaining in a certain position for a long period of time.   Improper preparation, form, or technique while performing a sport or activity.   Dehydration.   Injury.   Side effects of some medicines.   Abnormally low levels of the salts and ions in your blood (electrolytes), especially potassium and calcium. This could happen if you are taking water pills (diuretics) or you are pregnant.  Some underlying medical problems can make it more likely to develop cramps or spasms. These include, but are not limited to:   Diabetes.   Parkinson disease.   Hormone disorders, such as thyroid problems.   Alcohol abuse.   Diseases specific to muscles,  joints, and bones.   Blood vessel disease where not enough blood is getting to the muscles.  HOME CARE INSTRUCTIONS   Stay well hydrated. Drink enough water and fluids to keep your urine clear or pale yellow.  It may be helpful to massage, stretch, and relax the affected muscle.  For tight or tense muscles, use a warm towel, heating pad, or hot shower water directed to the affected area.  If you are sore or have pain after a cramp or spasm, applying ice to the affected area may relieve discomfort.  Put ice in a plastic bag.  Place a towel between your skin and the bag.  Leave the ice on for 15-20 minutes, 03-04 times a day.  Medicines used to treat a known cause of cramps or spasms may help reduce their frequency or severity. Only take over-the-counter or prescription medicines as directed by your caregiver. SEEK MEDICAL CARE IF:  Your cramps or spasms get more severe, more frequent, or do not improve over time.  MAKE SURE YOU:   Understand these instructions.  Will watch your condition.  Will get help right away if you are not doing well or get worse. Document Released: 06/20/2001 Document Revised: 04/25/2012 Document Reviewed: 12/16/2011 [facility] Patient Information 2015 [place]. This information is not intended to replace advice given to you by your health care provider. Make sure you discuss any questions you have with your health care provider.  Heat Therapy Heat therapy can help ease sore, stiff, injured, and  tight muscles and joints. Heat relaxes your muscles, which may help ease your pain.  RISKS AND COMPLICATIONS If you have any of the following conditions, do not use heat therapy unless your health care provider has approved:  Poor circulation.  Healing wounds or scarred skin in the area being treated.  Diabetes, heart disease, or high blood pressure.  Not being able to feel (numbness) the area being treated.  Unusual swelling of the area being  treated.  Active infections.  Blood clots.  Cancer.  Inability to communicate pain. This may include young children and people who have problems with their brain function (dementia).  Pregnancy. Heat therapy should only be used on old, pre-existing, or long-lasting (chronic) injuries. Do not use heat therapy on new injuries unless directed by your health care provider. HOW TO USE HEAT THERAPY There are several different kinds of heat therapy, including:  Moist heat pack.  Warm water bath.  Hot water bottle.  Electric heating pad.  Heated gel pack.  Heated wrap.  Electric heating pad. Use the heat therapy method suggested by your health care provider. Follow your health care provider's instructions on when and how to use heat therapy. GENERAL HEAT THERAPY RECOMMENDATIONS  Do not sleep while using heat therapy. Only use heat therapy while you are awake.  Your skin may turn pink while using heat therapy. Do not use heat therapy if your skin turns red.  Do not use heat therapy if you have new pain.  High heat or long exposure to heat can cause burns. Be careful when using heat therapy to avoid burning your skin.  Do not use heat therapy on areas of your skin that are already irritated, such as with a rash or sunburn. SEEK MEDICAL CARE IF:  You have blisters, redness, swelling, or numbness.  You have new pain.  Your pain is worse. MAKE SURE YOU:  Understand these instructions.  Will watch your condition.  Will get help right away if you are not doing well or get worse. Document Released: 03/23/2011 Document Revised: 05/15/2013 Document Reviewed: 02/21/2013 [facility] Patient Information 2015 [place], [place]. This information is not intended to replace advice given to you by your health care provider. Make sure you discuss any questions you have with your health care provider.  Torticollis, Acute You have suddenly (acutely) developed a twisted neck (torticollis).  This is usually a self-limited condition. CAUSES  Acute torticollis may be caused by malposition, trauma or infection. Most commonly, acute torticollis is caused by sleeping in an awkward position. Torticollis may also be caused by the flexion, extension or twisting of the neck muscles beyond their normal position. Sometimes, the exact cause may not be known. SYMPTOMS  Usually, there is pain and limited movement of the neck. Your neck may twist to one side. DIAGNOSIS  The diagnosis is often made by physical examination. X-rays, CT scans or MRIs may be done if there is a history of trauma or concern of infection. TREATMENT  For a common, stiff neck that develops during sleep, treatment is focused on relaxing the contracted neck muscle. Medications (including shots) may be used to treat the problem. Most cases resolve in several days. Torticollis usually responds to conservative physical therapy. If left untreated, the shortened and spastic neck muscle can cause deformities in the face and neck. Rarely, surgery is required. HOME CARE INSTRUCTIONS   Use over-the-counter and prescription medications as directed by your caregiver.  Do stretching exercises and massage the neck as directed by  your caregiver.  Follow up with physical therapy if needed and as directed by your caregiver. SEEK IMMEDIATE MEDICAL CARE IF:   You develop difficulty breathing or noisy breathing (stridor).  You drool, develop trouble swallowing or have pain with swallowing.  You develop numbness or weakness in the hands or feet.  You have changes in speech or vision.  You have problems with urination or bowel movements.  You have difficulty walking.  You have a fever.  You have increased pain. MAKE SURE YOU:   Understand these instructions.  Will watch your condition.  Will get help right away if you are not doing well or get worse. Document Released: 12/27/1999 Document Revised: 03/23/2011 Document Reviewed:  02/06/2009 [facility] Patient Information 2015 [place], [place]. This information is not intended to replace advice given to you by your health care provider. Make sure you discuss any questions you have with your health care provider.
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
## 2014-05-09 ENCOUNTER — Telehealth: Payer: Self-pay | Admitting: Pediatrics

## 2014-05-09 NOTE — Telephone Encounter (Signed)
TC to pt to schedule f/u appt. Pt is already scheduled to see peds teaching 4/28. Pt states that she has paperwork that may need to be filled out by Dr. Marina GoodellPerry for work. Advised that Dr. Marina GoodellPerry would be in the office tomorrow, but seeing other pts. Advised that we would try to help with paper work when she was in the office. Pt verbalized understanding.

## 2014-05-09 NOTE — Telephone Encounter (Signed)
Received my chart message from patient requesting a follow-up appt.  Please call to schedule an appt for f/u.  She can continue as a patient here until she is 19 yrs.

## 2014-05-10 ENCOUNTER — Encounter: Payer: Self-pay | Admitting: Pediatrics

## 2014-05-10 ENCOUNTER — Ambulatory Visit (INDEPENDENT_AMBULATORY_CARE_PROVIDER_SITE_OTHER): Payer: Medicaid Other | Admitting: Pediatrics

## 2014-05-10 VITALS — Temp 98.1°F | Wt 149.4 lb

## 2014-05-10 DIAGNOSIS — M62838 Other muscle spasm: Secondary | ICD-10-CM | POA: Insufficient documentation

## 2014-05-10 DIAGNOSIS — Z113 Encounter for screening for infections with a predominantly sexual mode of transmission: Secondary | ICD-10-CM | POA: Diagnosis not present

## 2014-05-10 DIAGNOSIS — M6248 Contracture of muscle, other site: Secondary | ICD-10-CM

## 2014-05-10 MED ORDER — NAPROXEN 500 MG PO TABS
500.0000 mg | ORAL_TABLET | Freq: Two times a day (BID) | ORAL | Status: DC
Start: 2014-05-10 — End: 2014-07-29

## 2014-05-10 NOTE — Progress Notes (Signed)
History was provided by the patient.  Debra [name] is a 19 y.o. female who is here for neck pain follow up from ED.    HPI:  19 yo F here for neck pain. One week ago she noticed a numb feeling to the left side of her neck. She didn't think she did anything to have this start; it was out of nowhere in the evening. This developed into pain and she was then unable to go to work. Pain started in the left trapezius and it has gotten worse. She went to the ED 4/24 and was diagnosed with bad torticollis. She was prescribed methocarbamol which has helped, but there is no relief in between. She is only taking at night because she gets sleepy and needs to be alert for work. She is taking the naproxen once per day in the afternoon. Naproxen helps with the sharp pain, but not the "annoying" feeling which is described as stiffness and numbness. Has some shooting pains. She's had a mild headache and nausea over the last few days. Eating well. She has blurry vision that started when this happened -- she has to blink her eyes a few times to see what things say, both with contacts and without. She's used ice and heat alternating which help when on her neck but worsens again afterward. Massage helps during as well but returns after.  Denies fever, SOB, cough, abdominal pain, bowel or bladder incontinence, weakness.  Patient also requesting STI screening today. She is asymptomatic but would like random testing.  The following portions of the patient's history were reviewed and updated as appropriate: allergies, current medications, past medical history, past social history and problem list.  Physical Exam:  Temp(Src) 98.1 F (36.7 C) (Temporal)  Wt 149 lb 6.4 oz (67.767 kg)  LMP 05/03/2014  Facility age limit for growth percentiles is 20 years. Patient's last menstrual period was 05/03/2014.    General:   alert, cooperative and well appearing, but noticeably keep neck straight     Skin:   normal  Oral  cavity:   lips, mucosa, and tongue normal; teeth and gums normal  Eyes:   sclerae white, pupils equal and reactive  Ears:   not examined  Nose: clear, no discharge  Neck:  Neck appearance: tightness and asymmetry on left trapezius with tenderness and spasms palpated along top of trapezius and Neck: supple  Lungs:  clear to auscultation bilaterally  Heart:   regular rate and rhythm, S1, S2 normal, no murmur, click, rub or gallop   Abdomen:  not examined  GU:  not examined  Extremities:   extremities normal, atraumatic, no cyanosis or edema  Neuro:  normal without focal findings, mental status, speech normal, alert and oriented x3, PERLA, cranial nerves 2-12 intact, muscle tone and strength normal and symmetric, reflexes normal and symmetric, sensation grossly normal and gait and station normal    Assessment/Plan:  1. Muscle spasms of neck - Ambulatory referral to Physical Therapy - naproxen (NAPROSYN) 500 MG tablet; Take 1 tablet (500 mg total) by mouth 2 (two) times daily with a meal.  Dispense: 30 tablet; Refill: 0 - May take 1/2 tablet (250 mg) of Riboxin each morning and continue 1 tablet (500 mg) each night to help stay awake for work - Work form filled out to be out from 4/17-4/30 due to inability to sit up for hours at a time  2. Routine screening for STI (sexually transmitted infection) - GC/chlamydia probe amp, urine - Patient will  need to follow up PCP for further HIV or RPR testing, as both were performed 09/2013   - Immunizations today: none - Follow-up visit as needed.    Celesta AverWhitney H Mckinzy Fuller, MD 05/10/2014

## 2014-05-10 NOTE — Patient Instructions (Signed)
Take Naproxen 1 tablet twice a day every single day until next Thursday.  Take 1/2 tablet of Riboxin in the morning and 1/2 tablet OR 1 tablet in the evening.  Physical therapy referral   Muscle Cramps and Spasms Muscle cramps and spasms are when muscles tighten by themselves. They usually get better within minutes. Muscle cramps are painful. They are usually stronger and last longer than muscle spasms. Muscle spasms may or may not be painful. They can last a few seconds or much longer. HOME CARE  Drink enough fluid to keep your pee (urine) clear or pale yellow.  Massage, stretch, and relax the muscle.  Use a warm towel, heating pad, or warm shower water on tight muscles.  Place ice on the muscle if it is tender or in pain.  Put ice in a plastic bag.  Place a towel between your skin and the bag.  Leave the ice on for 15-20 minutes, 03-04 times a day.  Only take medicine as told by your doctor. GET HELP RIGHT AWAY IF:  Your cramps or spasms get worse, happen more often, or do not get better with time. MAKE SURE YOU:  Understand these instructions.  Will watch your condition.  Will get help right away if you are not doing well or get worse. Document Released: 12/12/2007 Document Revised: 04/25/2012 Document Reviewed: 12/16/2011 [facility] Patient Information 2015 [place] [place]. This information is not intended to replace advice given to you by your health care provider. Make sure you discuss any questions you have with your health care provider.

## 2014-05-10 NOTE — Progress Notes (Signed)
I have seen the patient and I agree with the assessment and plan.   Havish Petties, M.D. Ph.D. Clinical Professor, Pediatrics 

## 2014-05-14 LAB — GC/CHLAMYDIA PROBE AMP, URINE
CHLAMYDIA, SWAB/URINE, PCR: NEGATIVE
GC Probe Amp, Urine: NEGATIVE

## 2014-05-24 ENCOUNTER — Encounter: Payer: Self-pay | Admitting: Physical Therapy

## 2014-05-24 ENCOUNTER — Ambulatory Visit: Payer: 59 | Attending: Pediatrics | Admitting: Physical Therapy

## 2014-05-24 DIAGNOSIS — M25512 Pain in left shoulder: Secondary | ICD-10-CM | POA: Diagnosis not present

## 2014-05-24 DIAGNOSIS — M542 Cervicalgia: Secondary | ICD-10-CM | POA: Diagnosis present
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
## 2014-05-31 ENCOUNTER — Ambulatory Visit: Payer: 59 | Admitting: Physical Therapy

## 2014-05-31 ENCOUNTER — Telehealth: Payer: Self-pay | Admitting: *Deleted

## 2014-05-31 DIAGNOSIS — M542 Cervicalgia: Secondary | ICD-10-CM

## 2014-05-31 DIAGNOSIS — M25512 Pain in left shoulder: Secondary | ICD-10-CM

## 2014-05-31 NOTE — Telephone Encounter (Signed)
Pt called and left message asking for note for work. she stated that she missed work because of her muscle spasms.
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
## 2014-05-31 NOTE — Patient Instructions (Signed)
Perform scap retract and pec stretches at work during the day

## 2014-06-01 ENCOUNTER — Ambulatory Visit (INDEPENDENT_AMBULATORY_CARE_PROVIDER_SITE_OTHER): Payer: 59 | Admitting: Pediatrics

## 2014-06-01 ENCOUNTER — Ambulatory Visit (INDEPENDENT_AMBULATORY_CARE_PROVIDER_SITE_OTHER): Payer: 59 | Admitting: Licensed Clinical Social Worker

## 2014-06-01 ENCOUNTER — Encounter: Payer: Self-pay | Admitting: Pediatrics

## 2014-06-01 VITALS — BP 112/70 | Temp 99.2°F | Wt 154.6 lb

## 2014-06-01 DIAGNOSIS — M542 Cervicalgia: Secondary | ICD-10-CM | POA: Diagnosis not present

## 2014-06-01 DIAGNOSIS — R05 Cough: Secondary | ICD-10-CM | POA: Diagnosis not present

## 2014-06-01 DIAGNOSIS — M62838 Other muscle spasm: Secondary | ICD-10-CM

## 2014-06-01 DIAGNOSIS — R200 Anesthesia of skin: Secondary | ICD-10-CM

## 2014-06-01 DIAGNOSIS — M6248 Contracture of muscle, other site: Secondary | ICD-10-CM

## 2014-06-01 DIAGNOSIS — R69 Illness, unspecified: Secondary | ICD-10-CM

## 2014-06-01 DIAGNOSIS — R058 Other specified cough: Secondary | ICD-10-CM

## 2014-06-01 DIAGNOSIS — R202 Paresthesia of skin: Secondary | ICD-10-CM

## 2014-06-01 MED ORDER — ALBUTEROL SULFATE HFA 108 (90 BASE) MCG/ACT IN AERS: 1.0000 | INHALATION_SPRAY | Freq: Four times a day (QID) | RESPIRATORY_TRACT | Status: DC | PRN

## 2014-06-01 MED ORDER — BECLOMETHASONE DIPROPIONATE 80 MCG/ACT IN AERS: 2.0000 | INHALATION_SPRAY | Freq: Two times a day (BID) | RESPIRATORY_TRACT | Status: DC | PRN
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
## 2014-06-01 NOTE — BH Specialist Note (Signed)
Referring Provider: Fortino SicHartsell, Angela, MD Session Time:  1145 - 1210 (25 minutes) Type of Service: Behavioral Health - Individual Interpreter: No.  Interpreter Name & Language: N/A   PRESENTING CONCERNS:  Debra [name] is a 19 y.o. female brought in by self. [name] strategies for chronic [facility].   GOALS ADDRESSED:  Enhance positive coping [name] related to pain reduction   INTERVENTIONS:  Assessed current condition/needs Built rapport Discussed integrated care Stress managment Supportive counselling   ASSESSMENT/OUTCOME:  Debra [name] presented as engaged but in pain during today's visit. She was interested in learning about relaxation or coping [name] that may help with her pain. Currently, using ice or heat packs, massages, and physical therapy are helpful. [facility] praised [name] for taking charge and going to PT and completing her at-home exercises. Provided supportive counseling around the difficulty of completing tasks and work while in pain.  [facility] provided psychoeducation on strategies such as deep breathing, guided imagery, and grounding. Debra [name] participated and found the deep breathing to be unsuccessful for her but was able to relax some with the guided imagery. [facility] gave list of apps that Debra [name] can use to continue to practice these [name].    PLAN:  Debra [name]  Scheduled next visit: none at this time  Debra [name], MSW, [employer]LCSWA Behavioral Health Coordinator/ Clinician [facility]  *No charge due to insurance/ provider status

## 2014-06-01 NOTE — Patient Instructions (Addendum)
It was great seeing you today in clinic. I am sorry your neck pain has not resolved. Continue to take the Naproxen 500 mg BID as needed. You no longer need to take the muscle relaxant since it has not helped. Continue to use ice packs and heating pads over your neck.   The most important thing for you to do will be to continue with physical therapy twice per week fo rthe next 7 weeks. Also make sure to do exercises at home.   The numbness and tingling in your arm may mean that there is cervical disc issues. I will provide a referral to Adult Neurology as further imaging may be warranted.   Also, you should follow-up with ophthalmology to make sure you contact prescription is up to date.   We will provide a note for you to return to work. Do not hesitate to call our clinic with questions as you transition to adult medicine.   Muscle Cramps and Spasms Muscle cramps and spasms occur when a muscle or muscles tighten and you have no control over this tightening (involuntary muscle contraction). They are a common problem and can develop in any muscle. The most common place is in the calf muscles of the leg. Both muscle cramps and muscle spasms are involuntary muscle contractions, but they also have differences:   Muscle cramps are sporadic and painful. They may last a few seconds to a quarter of an hour. Muscle cramps are often more forceful and last longer than muscle spasms.  Muscle spasms may or may not be painful. They may also last just a few seconds or much longer. CAUSES  It is uncommon for cramps or spasms to be due to a serious underlying problem. In many cases, the cause of cramps or spasms is unknown. Some common causes are:   Overexertion.   Overuse from repetitive motions (doing the same thing over and over).   Remaining in a certain position for a long period of time.   Improper preparation, form, or technique while performing a sport or activity.   Dehydration.   Injury.    Side effects of some medicines.   Abnormally low levels of the salts and ions in your blood (electrolytes), especially potassium and calcium. This could happen if you are taking water pills (diuretics) or you are pregnant.  Some underlying medical problems can make it more likely to develop cramps or spasms. These include, but are not limited to:   Diabetes.   Parkinson disease.   Hormone disorders, such as thyroid problems.   Alcohol abuse.   Diseases specific to muscles, joints, and bones.   Blood vessel disease where not enough blood is getting to the muscles.  HOME CARE INSTRUCTIONS   Stay well hydrated. Drink enough water and fluids to keep your urine clear or pale yellow.  It may be helpful to massage, stretch, and relax the affected muscle.  For tight or tense muscles, use a warm towel, heating pad, or hot shower water directed to the affected area.  If you are sore or have pain after a cramp or spasm, applying ice to the affected area may relieve discomfort.  Put ice in a plastic bag.  Place a towel between your skin and the bag.  Leave the ice on for 15-20 minutes, 03-04 times a day.  Medicines used to treat a known cause of cramps or spasms may help reduce their frequency or severity. Only take over-the-counter or prescription medicines as directed by your  caregiver. SEEK MEDICAL CARE IF:  Your cramps or spasms get more severe, more frequent, or do not improve over time.  MAKE SURE YOU:   Understand these instructions.  Will watch your condition.  Will get help right away if you are not doing well or get worse. Document Released: 06/20/2001 Document Revised: 04/25/2012 Document Reviewed: 12/16/2011 [facility] Patient Information 2015 [employer] [place]. This information is not intended to replace advice given to you by your health care provider. Make sure you discuss any questions you have with your health care provider.

## 2014-06-01 NOTE — Progress Notes (Signed)
I personally saw and evaluated the patient, and participated in the management and treatment plan as documented in the resident's note.  Bryssa Tones H 06/01/2014 2:27 PM

## 2014-06-02 NOTE — Telephone Encounter (Signed)
Pt was seen in clinic 06/01/2014 and should have been able to get a note then for work.

## 2014-06-04 ENCOUNTER — Ambulatory Visit: Payer: 59 | Admitting: Physical Therapy

## 2014-06-06 ENCOUNTER — Ambulatory Visit: Payer: 59 | Admitting: Rehabilitation

## 2014-06-06 DIAGNOSIS — M542 Cervicalgia: Secondary | ICD-10-CM
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
## 2014-06-07 ENCOUNTER — Telehealth: Payer: Self-pay

## 2014-06-07 NOTE — Telephone Encounter (Signed)
Patient plans on faxing us a note asking for limited work excuse. Will give to PTS doctor to fill out.

## 2014-06-08 NOTE — Telephone Encounter (Signed)
Form received by fax. Patient is requesting certain answers on form be changed by the attending. The 3 physicians in clinic today have not seen/examined patient. Dr Leotis ShamesAkintemi will give  form to Dr Ronalee RedHartsell to review. RN will send message to Dr Ronalee RedHartsell now also.

## 2014-06-11 ENCOUNTER — Encounter (HOSPITAL_COMMUNITY): Payer: Self-pay | Admitting: Emergency Medicine

## 2014-06-11 ENCOUNTER — Emergency Department (HOSPITAL_COMMUNITY)
Admission: EM | Admit: 2014-06-11 | Discharge: 2014-06-12 | Disposition: A | Discharge status: Home / Self Care | Payer: 59 | Attending: Emergency Medicine | Admitting: Emergency Medicine

## 2014-06-11 DIAGNOSIS — Z791 Long term (current) use of non-steroidal anti-inflammatories (NSAID): Secondary | ICD-10-CM | POA: Diagnosis not present

## 2014-06-11 DIAGNOSIS — Z862 Personal history of diseases of the blood and blood-forming organs and certain disorders involving the immune mechanism: Secondary | ICD-10-CM | POA: Diagnosis not present

## 2014-06-11 DIAGNOSIS — Q828 Other specified congenital malformations of skin: Secondary | ICD-10-CM | POA: Diagnosis not present

## 2014-06-11 DIAGNOSIS — Z7951 Long term (current) use of inhaled steroids: Secondary | ICD-10-CM | POA: Insufficient documentation

## 2014-06-11 DIAGNOSIS — Z8669 Personal history of other diseases of the nervous system and sense organs: Secondary | ICD-10-CM | POA: Insufficient documentation

## 2014-06-11 DIAGNOSIS — M419 Scoliosis, unspecified: Secondary | ICD-10-CM | POA: Insufficient documentation

## 2014-06-11 DIAGNOSIS — Z87891 Personal history of nicotine dependence: Secondary | ICD-10-CM | POA: Diagnosis not present

## 2014-06-11 DIAGNOSIS — R202 Paresthesia of skin: Secondary | ICD-10-CM | POA: Diagnosis not present

## 2014-06-11 DIAGNOSIS — Z8742 Personal history of other diseases of the female genital tract: Secondary | ICD-10-CM | POA: Insufficient documentation

## 2014-06-11 DIAGNOSIS — Z79899 Other long term (current) drug therapy: Secondary | ICD-10-CM | POA: Insufficient documentation

## 2014-06-11 DIAGNOSIS — Z872 Personal history of diseases of the skin and subcutaneous tissue: Secondary | ICD-10-CM | POA: Insufficient documentation

## 2014-06-11 DIAGNOSIS — Z8619 Personal history of other infectious and parasitic diseases: Secondary | ICD-10-CM | POA: Diagnosis not present

## 2014-06-11 DIAGNOSIS — K219 Gastro-esophageal reflux disease without esophagitis: Secondary | ICD-10-CM | POA: Diagnosis not present

## 2014-06-11 DIAGNOSIS — G8929 Other chronic pain: Secondary | ICD-10-CM | POA: Insufficient documentation

## 2014-06-11 DIAGNOSIS — M542 Cervicalgia: Secondary | ICD-10-CM | POA: Diagnosis not present

## 2014-06-11 NOTE — ED Notes (Signed)
Pt. reports chronic neck ( posterior) pain for several months unrelieved by prescription pain medications  , diagnosed with torticollis and referred to neurologist and physical therapist . Denies fall or injury. Pt. states pain radiates to upper back with left arm tingling.

## 2014-06-12 DIAGNOSIS — M542 Cervicalgia: Secondary | ICD-10-CM | POA: Diagnosis not present

## 2014-06-12 MED ORDER — DIAZEPAM 5 MG PO TABS
5.0000 mg | ORAL_TABLET | Freq: Once | ORAL | Status: AC
  Administered 2014-06-12: 5 mg via ORAL
  Filled 2014-06-12: qty 1

## 2014-06-12 MED ORDER — KETOROLAC TROMETHAMINE 60 MG/2ML IM SOLN
60.0000 mg | Freq: Once | INTRAMUSCULAR | Status: AC
  Administered 2014-06-12: 60 mg via INTRAMUSCULAR
  Filled 2014-06-12: qty 2

## 2014-06-12 NOTE — Discharge Instructions (Signed)
It is important for you to follow-up with your primary care for further evaluation and management of your neck discomfort. Please continue taking her medications that you have at home for your discomfort. Return to ED for new or worsening symptoms.

## 2014-06-12 NOTE — Telephone Encounter (Signed)
Hi Debra [name],  If Debra [name] is in clinic today, she can fill out the form.  Otherwise, [facility] [phone]?  Thanks!! Angie

## 2014-06-12 NOTE — Telephone Encounter (Signed)
PT called checking on the form. Please call her [phone]
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
## 2014-06-12 NOTE — Telephone Encounter (Signed)
Called patient to inform we will work on form today. Patient spoke with Dr Dwain SarnaHales on specifics. Told patient I would update her today.

## 2014-06-12 NOTE — Telephone Encounter (Signed)
Form completed, along with work note. Will fax to [phone] and also put hard copy at front office for patient to pick up. Called Ms Ave FilterChandler and she agrees with plan.

## 2014-06-12 NOTE — ED Notes (Signed)
Pt stable, ambulatory, states understanding of discharge instructions 

## 2014-06-13 ENCOUNTER — Ambulatory Visit: Payer: 59 | Attending: Pediatrics | Admitting: Rehabilitation

## 2014-06-13 DIAGNOSIS — M542 Cervicalgia: Secondary | ICD-10-CM | POA: Diagnosis not present

## 2014-06-13 DIAGNOSIS — M25512 Pain in left shoulder: Secondary | ICD-10-CM | POA: Diagnosis present
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
## 2014-06-18 ENCOUNTER — Ambulatory Visit: Payer: 59 | Admitting: Rehabilitation

## 2014-06-18 DIAGNOSIS — M542 Cervicalgia: Secondary | ICD-10-CM
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
## 2014-06-19 ENCOUNTER — Ambulatory Visit (INDEPENDENT_AMBULATORY_CARE_PROVIDER_SITE_OTHER): Payer: 59 | Admitting: Licensed Clinical Social Worker

## 2014-06-19 ENCOUNTER — Ambulatory Visit (INDEPENDENT_AMBULATORY_CARE_PROVIDER_SITE_OTHER): Payer: 59 | Admitting: Pediatrics

## 2014-06-19 ENCOUNTER — Encounter: Payer: Self-pay | Admitting: Pediatrics

## 2014-06-19 VITALS — BP 132/72 | Wt 152.8 lb

## 2014-06-19 DIAGNOSIS — R69 Illness, unspecified: Secondary | ICD-10-CM | POA: Diagnosis not present

## 2014-06-19 DIAGNOSIS — G959 Disease of spinal cord, unspecified: Secondary | ICD-10-CM | POA: Diagnosis not present

## 2014-06-19 NOTE — Patient Instructions (Addendum)
[name], I have reviewed the records from Neurology & the MRI reports. Your MRI is normal. The neurologist planned to start steroids to help with pain & inflammation. Please start the prescribed course of steroids & call for a follow up appt with Neurology. We would also strongly encourage you to connect with Family medicine to transition care as you will be turning 19 soon. We will make you a referral to pain clinic for a consult.

## 2014-06-19 NOTE — Progress Notes (Signed)
Subjective:    Debra [name] is a 19 y.o. female accompanied by self presenting to the clinic today for a follow up after [name] appointment for chronic neck pain. Debra [name] was seen bu Debra [name] last week & also had a neck MRI. She has not received a call yet regarding the results but they suspected a herniated disc according to Debra [name] has a h/o chronic pain involving chronic abdominal pajn, right wrist tendinitis, chronic ankle pain and recent issues with neck pain for the past 2 months. She has been seen by rehab/PT & is receiving therapy twice a week. She was continued on a muscle relaxant (robaxin 250 mg in AM and 500 mg in PM) along with naproxen 500 mg BID. Debra [name] is tearful today as she reports her pain is not improving & she also had numbness of her L leg last week after Neuro visit & fell. This worsened her L sided neck & backpain. She had been to the ED 5/30 for worsening pain & had received toradol & valium & reports that it helped. She is not working currently due to pain- works from home for [employer], She needs disability papers to be completed & would like to return to work. Debra [name] does not have much support & lives alone. She has friend who is living with her now to help her. She would like to know the underlying cause of her pain & is aware of the addictive nature of narcotics. She is not receving any therapy for anxiety. She does not have a PCP & so far has been seen in trhe adolescent clinic. Multiple attempts have been made to encourage transition to family medicine. She has a list & has been looikng for a PCP.  Review of Systems  Constitutional: Positive for fatigue. Negative for fever.  Respiratory: Negative for cough.   Cardiovascular: Negative for chest pain.  Gastrointestinal: Negative for abdominal pain, diarrhea, constipation and blood in stool.  Musculoskeletal: Positive for myalgias, back pain and neck pain.  Skin: Negative for rash.    Neurological: Positive for headaches. Negative for tremors, seizures, syncope, weakness and light-headedness.  Psychiatric/Behavioral: Positive for sleep disturbance. The patient is nervous/anxious.   All other systems reviewed and are negative.      Objective:   Physical Exam .BP 132/72 mmHg  Wt 152 lb 12.8 oz (69.31 kg)  LMP 06/04/2014 General:  Alert, co-operative, tearful     Skin:  normal  Oral cavity:  lips, mucosa, and tongue normal; teeth and gums normal  Eyes:  sclerae white, pupils equal and reactive  Ears:  Not done  Nose: clear, no discharge  Neck:  Neck appearance: normal appearance. Pain with flexion, extension, and with moving neck to the left. Tenderness to palpation over left sternocleidomastoid, left trapezius and left supraspinatus muscle. Trachea:midline, Neck: No masses, no LAD, and Thyroid exam: Normal  Lungs: clear to auscultation bilaterally  Heart:  regular rate and rhythm, S1, S2 normal, no murmur, click, rub or gallop   Abdomen: soft, non-tender; bowel sounds normal; no masses, no organomegaly  GU: not examined  Extremities MSK:  extremities normal, atraumatic, no cyanosis or edema. Tenderness to palpation over left sternocleidomastoid, left trapezius and left supraspinatus muscle.Normal strength and ROM of upper and lower extremities. Normal grip strength. Mild tenderness to palpation over paraspinal muscles  Neuro: normal without focal findings, mental status, speech normal, alert and oriented x3, PERLA and reflexes normal and symmetric, normal sensation in upper and lower extremities.  Assessment & Plan:  Cervical myelopathy Obtained records from the Neuology clinic. Also called the Neuro clinic & requested follow up appt for Debra [name]. The cervical & upper thoracic  MRI report was reviewed- it was negative. The suspected diagnosis was cervical myelopthay & the plan was to start prednisone if MRI  was negative & to follow up in 1 week. The Neurologist called in the prednisone for Debra [name] during our clinic visit & she will be starting it. Also advused to continue her naproxen as needed & robaxin. Continue PT. Discussed referral to pain clinic to explore alternate therapy. - Ambulatory referral to Pain Clinic  Referred to [facility] who met with the patient for a brief session. Also gave her information to call [facility] to transition.  Pt will email me some paperwork for disability- to be faxed.  Pt will also call & set up f/u with Neurologist in 1 week.  The visit lasted for 25 minutes and > 50% of the visit time was spent on counseling & care coordination. Return if symptoms worsen or fail to improve.  Claudean Kinds, MD 06/22/2014 2:07 PM

## 2014-06-20 ENCOUNTER — Encounter: Payer: Self-pay | Admitting: Pediatrics

## 2014-06-20 ENCOUNTER — Ambulatory Visit: Payer: 59

## 2014-06-21 NOTE — BH Specialist Note (Signed)
Referring Provider: Dr. Tobey Bride PCP: No primary care provider on file. Session Time:  2:05 - 2:28  (23 min) Type of Service: Behavioral Health - Individual/Family Interpreter: No.  Interpreter Name & Language: NA  Debra [name] voiced consent for Ph D. Psychology student Debra [name] to be present during our visit.    PRESENTING CONCERNS:  Debra [name] is a 19 y.o. female brought in by patient. Debra [name] was referred to [facility] for chronic pain and also to assess readiness to connect to adult care.   GOALS ADDRESSED:  Enhance positive coping skills Increase adequate supports and resources     INTERVENTIONS:  Assessed current condition/needs Built rapport Discussed integrated care Supportive counseling    ASSESSMENT/OUTCOME:  Debra [name] is kind and appropriate and does appear physically uncomfortable today. She stated goal of medical diagnosis for back/neck pain. She stated that her pain is seriously affecting her ADLs and her friend moved into her home to help take care of her. Validate feelings. She stated willingness to try some relaxation techniques today. Her doctor's office apparently called during the session and Debra [name] took the call and asked good questions ("What were the results? What does that mean? How will this medication affect me? What are my next steps?"). She appropriately stated feelings afterward, as that office could not give a definitive diagnosis and wanted her to come for more tests.   Debra [name] stated many positive coping skills, taking bathes, taking a vacation with her family to celebrate her birthday, taking time to ice back and neck, going to PT and guided imagery from previous appt with another [facility] at this office. Given different options, she wanted to try deep breathing again. Practiced today, Debra [name] stated no relief. Sympathized with patient.   Debra [name] has made calls to different adult care offices but doesn't have this list with her today. Offered help  connecting, she declined today. Gave my card in case Debra [name] would like more help either connecting or relaxing.    PLAN:  Attend dr's visits and take medications as prescribed. Continue to use support systems and coping skills. Keep calling adult medicine offices, if you would like help connecting, please call this [facility] and help will be given. Debra [name] voiced understanding.   Scheduled next visit: None at this time.  Debra [name] Behavioral Health Clinician [facility]

## 2014-06-22 ENCOUNTER — Telehealth: Payer: Self-pay

## 2014-06-22 NOTE — Telephone Encounter (Signed)
Patient left VM regarding forms needed. Spoke with patient and confirmed the correct paperwork was already in Dr Lonie Peak inbox. Informed her doctor will be able to complete on Monday and her staff will call when ready to pick up.

## 2014-06-26 NOTE — Telephone Encounter (Signed)
Form found in scan/fax folder. Copy made for HIM and original placed up front. Patient notified by phone to come and pick up forms.

## 2014-07-02 ENCOUNTER — Telehealth: Payer: Self-pay | Admitting: Pediatrics

## 2014-07-02 NOTE — Telephone Encounter (Signed)
Pt came in requesting to have a form filled out for Short term Disability for work, prev form did not include restrictions for work schedule for pt. Placed form in Nurse's Pod

## 2014-07-02 NOTE — Telephone Encounter (Signed)
Form placed in Dr Lonie Peak folder to be completed and signed.

## 2014-07-02 NOTE — Telephone Encounter (Signed)
Pt came in requesting a call back from Dr. Wynetta Emery regarding last visit, pt read a diagnosis on mychart regarding last visit after being referred to a Specialist. Pt is requesting a call back ASAP please!!!  Dymon [phone] cell #

## 2014-07-03 NOTE — Telephone Encounter (Signed)
Forms and visit copies picked up from green pod and hand delivered to HIM now. Viann Shove will fax today.

## 2014-07-03 NOTE — Telephone Encounter (Signed)
Forms completed on 6/20 by Dr Wynetta Emery.

## 2014-07-03 NOTE — Telephone Encounter (Signed)
Called patient back  On 07/02/14 & clarified reports from Neurology. Also completed her disability forms.  Tobey Bride, MD Pediatrician [facility] [address] [place], [place] [address] Ph: [phone] Fax: [phone] 07/03/2014 1:26 PM

## 2014-07-09 ENCOUNTER — Ambulatory Visit: Payer: 59 | Admitting: Physical Therapy

## 2014-07-10 ENCOUNTER — Ambulatory Visit: Payer: 59 | Admitting: Physical Therapy

## 2014-07-17 ENCOUNTER — Telehealth: Payer: Self-pay | Admitting: Pediatrics

## 2014-07-17 NOTE — Telephone Encounter (Signed)
Sent to PCP ?

## 2014-07-17 NOTE — Telephone Encounter (Signed)
Karime called today stating she was told the company of [employer] try call to Dr.Simha about her short term disability but they couldn't reach her, so she give me the phone number where Dr.Simha can call them. Ph number is [phone]. Tinea ph number is [phone].

## 2014-07-18 ENCOUNTER — Telehealth: Payer: Self-pay | Admitting: *Deleted

## 2014-07-18 DIAGNOSIS — G959 Disease of spinal cord, unspecified: Secondary | ICD-10-CM

## 2014-07-18 MED ORDER — NAPROXEN 500 MG PO TABS
500.0000 mg | ORAL_TABLET | Freq: Two times a day (BID) | ORAL | Status: DC | PRN
Start: 2014-07-18 — End: 2014-07-29

## 2014-07-18 NOTE — Telephone Encounter (Signed)
RN sent message to PCP who will call [employer] today.

## 2014-07-18 NOTE — Telephone Encounter (Signed)
Refill for Naproxen sent to the pharmacy. Also placed a referral for PT & Orthopedics. Please let Ms. Janina know that the medication has been sent & that I have called [employer] & left a message regarding her disability. Please advise her to contact the Neurologist to clarify these restrictions & that if she needs these restrictions for more than 60 days, she will need to get the form completed by the Neurologist. Her PT can also give her a letter. Thanks.  Tobey BrideShruti Brayla Pat, MD Pediatrician [facility] [address], [place] [address] Ph: [phone] Fax: [phone] 07/18/2014 4:14 PM

## 2014-07-18 NOTE — Telephone Encounter (Signed)
Caller left a message asking for a refill for Naproxen for neck and shoulder pain. Patient stated that she called PT and they need a new referral from the MD. She is also requesting a referral to Orthopedics. Patient would like a call back.

## 2014-07-18 NOTE — Telephone Encounter (Signed)
Received call from "Nicholos JohnsKathleen" at [employer] re: Ms Debra [name].   They need dates of restrictions; clarification of what is meant by split shifts e.g.. Is it 8 hr shifts M-F and how many days in a row; if she needs a break in between how long does the break need to be.  Since she sees a neurologist they would like to know if he or she is in agreement.  The caller left her number as [phone] and she stated she is there 0800-1600 PST and has a confidential voicemail.

## 2014-07-18 NOTE — Telephone Encounter (Signed)
Called Ms. Nicholos JohnsKathleen from [employer] at 3:45 pm & left a detailed voice message. Advised starting the work restriction rightaway 07/19/14.  I recommended M-F 5 day work for 8 hrs with 5 minute break every hour. Noa previously worked 4 days for 10 hr shifts. Recommended restriction for 2 months & if needed further extension, she needs to contact the Neurologist  Dr Antoniet[facility]uzi at [facility] & her PT. Will make referral to Ortho as she continues to experience chronic pain. I have given Ms. Nicholos JohnsKathleen my cell no. so she can contact me if further questions.  Tobey BrideShruti Orlanda Frankum, MD Pediatrician [facility] [address], [place] [address] Ph: [phone] Fax: [phone] 07/18/2014 4:04 PM

## 2014-07-18 NOTE — Telephone Encounter (Signed)
Called and spoke to patient and gave her all of the information as per Dr. Wynetta EmerySimha. [name] voiced understanding and appreciated Dr. Lonie PeakSimha's efforts and the call.

## 2014-07-26 ENCOUNTER — Encounter (HOSPITAL_COMMUNITY): Payer: Self-pay | Admitting: Emergency Medicine

## 2014-07-26 ENCOUNTER — Emergency Department (HOSPITAL_COMMUNITY)
Admission: EM | Admit: 2014-07-26 | Discharge: 2014-07-27 | Disposition: A | Discharge status: Other Acute Inpatient Hospital | Payer: 59 | Attending: Emergency Medicine | Admitting: Emergency Medicine

## 2014-07-26 DIAGNOSIS — Z87891 Personal history of nicotine dependence: Secondary | ICD-10-CM | POA: Insufficient documentation

## 2014-07-26 DIAGNOSIS — Z8719 Personal history of other diseases of the digestive system: Secondary | ICD-10-CM | POA: Diagnosis not present

## 2014-07-26 DIAGNOSIS — E876 Hypokalemia: Secondary | ICD-10-CM | POA: Diagnosis not present

## 2014-07-26 DIAGNOSIS — F411 Generalized anxiety disorder: Secondary | ICD-10-CM | POA: Diagnosis present

## 2014-07-26 DIAGNOSIS — G8929 Other chronic pain: Secondary | ICD-10-CM | POA: Diagnosis not present

## 2014-07-26 DIAGNOSIS — Z87828 Personal history of other (healed) physical injury and trauma: Secondary | ICD-10-CM | POA: Insufficient documentation

## 2014-07-26 DIAGNOSIS — Z862 Personal history of diseases of the blood and blood-forming organs and certain disorders involving the immune mechanism: Secondary | ICD-10-CM | POA: Diagnosis not present

## 2014-07-26 DIAGNOSIS — F1210 Cannabis abuse, uncomplicated: Secondary | ICD-10-CM | POA: Insufficient documentation

## 2014-07-26 DIAGNOSIS — F332 Major depressive disorder, recurrent severe without psychotic features: Secondary | ICD-10-CM | POA: Diagnosis present

## 2014-07-26 DIAGNOSIS — T402X2A Poisoning by other opioids, intentional self-harm, initial encounter: Secondary | ICD-10-CM | POA: Diagnosis present

## 2014-07-26 DIAGNOSIS — Z79899 Other long term (current) drug therapy: Secondary | ICD-10-CM | POA: Diagnosis not present

## 2014-07-26 DIAGNOSIS — Z8619 Personal history of other infectious and parasitic diseases: Secondary | ICD-10-CM | POA: Diagnosis not present

## 2014-07-26 DIAGNOSIS — T50902A Poisoning by unspecified drugs, medicaments and biological substances, intentional self-harm, initial encounter: Secondary | ICD-10-CM

## 2014-07-26 DIAGNOSIS — T40602A Poisoning by unspecified narcotics, intentional self-harm, initial encounter: Secondary | ICD-10-CM | POA: Diagnosis not present

## 2014-07-26 DIAGNOSIS — Z8742 Personal history of other diseases of the female genital tract: Secondary | ICD-10-CM | POA: Diagnosis not present

## 2014-07-26 DIAGNOSIS — F1110 Opioid abuse, uncomplicated: Secondary | ICD-10-CM | POA: Diagnosis not present

## 2014-07-26 DIAGNOSIS — Y999 Unspecified external cause status: Secondary | ICD-10-CM | POA: Diagnosis not present

## 2014-07-26 DIAGNOSIS — Y939 Activity, unspecified: Secondary | ICD-10-CM | POA: Diagnosis not present

## 2014-07-26 DIAGNOSIS — M419 Scoliosis, unspecified: Secondary | ICD-10-CM | POA: Diagnosis not present

## 2014-07-26 DIAGNOSIS — Y929 Unspecified place or not applicable: Secondary | ICD-10-CM | POA: Diagnosis not present

## 2014-07-26 DIAGNOSIS — T40601A Poisoning by unspecified narcotics, accidental (unintentional), initial encounter: Secondary | ICD-10-CM | POA: Diagnosis present

## 2014-07-26 LAB — URINALYSIS, ROUTINE W REFLEX MICROSCOPIC
BILIRUBIN URINE: NEGATIVE
Glucose, UA: NEGATIVE mg/dL
Hgb urine dipstick: NEGATIVE
KETONES UR: 40 mg/dL — AB
Leukocytes, UA: NEGATIVE
NITRITE: NEGATIVE
Protein, ur: NEGATIVE mg/dL
Specific Gravity, Urine: 1.029 (ref 1.005–1.030)
UROBILINOGEN UA: 0.2 mg/dL (ref 0.0–1.0)
pH: 6 (ref 5.0–8.0)

## 2014-07-26 LAB — I-STAT BETA HCG BLOOD, ED (MC, WL, AP ONLY)

## 2014-07-26 LAB — CBC
HCT: 36.5 % (ref 36.0–46.0)
Hemoglobin: 12.6 g/dL (ref 12.0–15.0)
MCH: 27.8 pg (ref 26.0–34.0)
MCHC: 34.5 g/dL (ref 30.0–36.0)
MCV: 80.4 fL (ref 78.0–100.0)
Platelets: 327 10*3/uL (ref 150–400)
RBC: 4.54 MIL/uL (ref 3.87–5.11)
RDW: 13.6 % (ref 11.5–15.5)
WBC: 7 10*3/uL (ref 4.0–10.5)

## 2014-07-26 LAB — COMPREHENSIVE METABOLIC PANEL
ALK PHOS: 69 U/L (ref 38–126)
ALT: 11 U/L — ABNORMAL LOW (ref 14–54)
AST: 17 U/L (ref 15–41)
Albumin: 4.6 g/dL (ref 3.5–5.0)
Anion gap: 10 (ref 5–15)
BUN: 10 mg/dL (ref 6–20)
CO2: 22 mmol/L (ref 22–32)
Calcium: 9.6 mg/dL (ref 8.9–10.3)
Chloride: 105 mmol/L (ref 101–111)
Creatinine, Ser: 0.84 mg/dL (ref 0.44–1.00)
Glucose, Bld: 121 mg/dL — ABNORMAL HIGH (ref 65–99)
Potassium: 2.9 mmol/L — ABNORMAL LOW (ref 3.5–5.1)
Sodium: 137 mmol/L (ref 135–145)
TOTAL PROTEIN: 7.5 g/dL (ref 6.5–8.1)
Total Bilirubin: 0.5 mg/dL (ref 0.3–1.2)

## 2014-07-26 LAB — RAPID URINE DRUG SCREEN, HOSP PERFORMED
AMPHETAMINES: NOT DETECTED
Barbiturates: NOT DETECTED
Benzodiazepines: NOT DETECTED
Cocaine: NOT DETECTED
Opiates: POSITIVE — AB
TETRAHYDROCANNABINOL: POSITIVE — AB

## 2014-07-26 LAB — SALICYLATE LEVEL: Salicylate Lvl: <4 mg/dL (ref 2.8–30.0)

## 2014-07-26 LAB — ETHANOL: Alcohol, Ethyl (B): <5 mg/dL (ref <5)

## 2014-07-26 LAB — ACETAMINOPHEN LEVEL: Acetaminophen (Tylenol), Serum: 31 ug/mL — ABNORMAL HIGH (ref 10–30)

## 2014-07-26 MED ORDER — POTASSIUM CHLORIDE CRYS ER 20 MEQ PO TBCR
40.0000 meq | EXTENDED_RELEASE_TABLET | Freq: Once | ORAL | Status: AC
  Administered 2014-07-26: 40 meq via ORAL
  Filled 2014-07-26: qty 2

## 2014-07-26 MED ORDER — ACETAMINOPHEN 325 MG PO TABS: 650.0000 mg | ORAL_TABLET | Freq: Once | ORAL | Status: DC
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
## 2014-07-26 NOTE — ED Notes (Signed)
Pt has shoes, shirt, 1 gold colored bracelet and 1 gold colored ring. Placed in belongings bag and is at nursing station
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
## 2014-07-26 NOTE — ED Notes (Signed)
Pt given something to drink and vomited it up.

## 2014-07-26 NOTE — ED Notes (Signed)
Pt. In burgundy scrubs. Pt. And belongings searched and wanded by security.

## 2014-07-26 NOTE — ED Notes (Signed)
Debra [name], sister [phone]

## 2014-07-26 NOTE — ED Notes (Signed)
Phone call complete to poison control-[place].  Made aware that pt took 17 Vicodins.  Recommended to test liver function, acetaminophen, salicylate, EKG, cardiac monitoring and if pt becomes sedated or experiences respiratory distress administer narcan.

## 2014-07-26 NOTE — ED Notes (Signed)
Pt. Sister at bedside. Pt. Comfortable and resting.

## 2014-07-26 NOTE — ED Notes (Signed)
PER EMS- pt picked up from home c/o overdose and possible suicidal. Reported that pt took 17- Vicodin x4hours.  Pt's friend called police 4hours ago and reported pt was SI and took only 1 vicodin.  Police went out to assess the situation.  4 hours later friends called 911 again stating that pt took 17 vicodins and was stating that she was suicidal. Ems reports upon arrival pt was alert but lying on the floor. Pt arrived to ED alert and oriented per baseline denying SI.  Vitals WNL.

## 2014-07-26 NOTE — ED Notes (Signed)
MD at bedside. 

## 2014-07-27 ENCOUNTER — Inpatient Hospital Stay (HOSPITAL_COMMUNITY)
Admission: AD | Admit: 2014-07-27 | Discharge: 2014-07-29 | DRG: 885 | Disposition: A | Discharge status: Home / Self Care | Payer: 59 | Source: Intra-hospital | Attending: Psychiatry | Admitting: Psychiatry

## 2014-07-27 ENCOUNTER — Encounter (HOSPITAL_COMMUNITY): Payer: Self-pay

## 2014-07-27 DIAGNOSIS — E785 Hyperlipidemia, unspecified: Secondary | ICD-10-CM | POA: Diagnosis present

## 2014-07-27 DIAGNOSIS — F411 Generalized anxiety disorder: Secondary | ICD-10-CM | POA: Diagnosis present

## 2014-07-27 DIAGNOSIS — F332 Major depressive disorder, recurrent severe without psychotic features: Secondary | ICD-10-CM | POA: Diagnosis present

## 2014-07-27 DIAGNOSIS — R05 Cough: Secondary | ICD-10-CM

## 2014-07-27 DIAGNOSIS — L700 Acne vulgaris: Secondary | ICD-10-CM

## 2014-07-27 DIAGNOSIS — T402X2A Poisoning by other opioids, intentional self-harm, initial encounter: Secondary | ICD-10-CM | POA: Diagnosis not present

## 2014-07-27 DIAGNOSIS — T40602A Poisoning by unspecified narcotics, intentional self-harm, initial encounter: Secondary | ICD-10-CM

## 2014-07-27 DIAGNOSIS — D573 Sickle-cell trait: Secondary | ICD-10-CM | POA: Diagnosis present

## 2014-07-27 DIAGNOSIS — G4700 Insomnia, unspecified: Secondary | ICD-10-CM | POA: Diagnosis present

## 2014-07-27 DIAGNOSIS — R058 Other specified cough: Secondary | ICD-10-CM

## 2014-07-27 DIAGNOSIS — T40601A Poisoning by unspecified narcotics, accidental (unintentional), initial encounter: Secondary | ICD-10-CM | POA: Diagnosis present

## 2014-07-27 LAB — ACETAMINOPHEN LEVEL: Acetaminophen (Tylenol), Serum: 18 ug/mL (ref 10–30)

## 2014-07-27 LAB — POTASSIUM: Potassium: 3.5 mmol/L (ref 3.5–5.1)

## 2014-07-27 MED ORDER — FLUOXETINE HCL 10 MG PO CAPS
10.0000 mg | ORAL_CAPSULE | Freq: Every day | ORAL | Status: DC
  Administered 2014-07-27: 10 mg via ORAL
  Filled 2014-07-27: qty 1

## 2014-07-27 MED ORDER — ALUM & MAG HYDROXIDE-SIMETH 200-200-20 MG/5ML PO SUSP: 30.0000 mL | ORAL | Status: DC | PRN

## 2014-07-27 MED ORDER — LORAZEPAM 0.5 MG PO TABS: 1.0000 mg | ORAL_TABLET | Freq: Three times a day (TID) | ORAL | Status: DC | PRN

## 2014-07-27 MED ORDER — ONDANSETRON HCL 4 MG PO TABS
4.0000 mg | ORAL_TABLET | Freq: Three times a day (TID) | ORAL | Status: DC | PRN
  Administered 2014-07-27: 4 mg via ORAL
  Filled 2014-07-27: qty 1

## 2014-07-27 MED ORDER — ACETAMINOPHEN 325 MG PO TABS: 650.0000 mg | ORAL_TABLET | Freq: Four times a day (QID) | ORAL | Status: DC | PRN

## 2014-07-27 MED ORDER — NAPROXEN 375 MG PO TABS
375.0000 mg | ORAL_TABLET | Freq: Two times a day (BID) | ORAL | Status: DC
  Filled 2014-07-27 (×2): qty 1

## 2014-07-27 MED ORDER — ENSURE ENLIVE PO LIQD: 237.0000 mL | Freq: Two times a day (BID) | ORAL | Status: DC

## 2014-07-27 MED ORDER — FLUCONAZOLE 150 MG PO TABS
150.0000 mg | ORAL_TABLET | Freq: Once | ORAL | Status: AC
  Administered 2014-07-27: 150 mg via ORAL
  Filled 2014-07-27 (×2): qty 1

## 2014-07-27 MED ORDER — ALBUTEROL SULFATE HFA 108 (90 BASE) MCG/ACT IN AERS: 1.0000 | INHALATION_SPRAY | Freq: Four times a day (QID) | RESPIRATORY_TRACT | Status: DC | PRN

## 2014-07-27 MED ORDER — NAPROXEN 375 MG PO TABS
375.0000 mg | ORAL_TABLET | Freq: Two times a day (BID) | ORAL | Status: DC
  Administered 2014-07-27 – 2014-07-29 (×4): 375 mg via ORAL
  Filled 2014-07-27 (×9): qty 1

## 2014-07-27 MED ORDER — MAGNESIUM HYDROXIDE 400 MG/5ML PO SUSP: 30.0000 mL | Freq: Every day | ORAL | Status: DC | PRN

## 2014-07-27 MED ORDER — FLUOXETINE HCL 10 MG PO CAPS
10.0000 mg | ORAL_CAPSULE | Freq: Every day | ORAL | Status: DC
  Administered 2014-07-28 – 2014-07-29 (×2): 10 mg via ORAL
  Filled 2014-07-27 (×4): qty 1

## 2014-07-27 MED ORDER — NICOTINE POLACRILEX 2 MG MT GUM
2.0000 mg | CHEWING_GUM | OROMUCOSAL | Status: DC | PRN
  Administered 2014-07-28: 2 mg via ORAL
  Filled 2014-07-27: qty 1

## 2014-07-27 MED ORDER — LORAZEPAM 1 MG PO TABS: 1.0000 mg | ORAL_TABLET | Freq: Three times a day (TID) | ORAL | Status: DC | PRN

## 2014-07-27 MED ORDER — POTASSIUM CHLORIDE CRYS ER 10 MEQ PO TBCR
10.0000 meq | EXTENDED_RELEASE_TABLET | Freq: Every day | ORAL | Status: DC
  Administered 2014-07-28 – 2014-07-29 (×2): 10 meq via ORAL
  Filled 2014-07-27 (×4): qty 1

## 2014-07-27 MED ORDER — GABAPENTIN 600 MG PO TABS
300.0000 mg | ORAL_TABLET | Freq: Every day | ORAL | Status: DC
  Administered 2014-07-27: 300 mg via ORAL
  Filled 2014-07-27: qty 0.5
  Filled 2014-07-27: qty 1
  Filled 2014-07-27: qty 0.5

## 2014-07-27 MED ORDER — IBUPROFEN 200 MG PO TABS: 600.0000 mg | ORAL_TABLET | Freq: Three times a day (TID) | ORAL | Status: DC | PRN

## 2014-07-27 MED ORDER — POTASSIUM CHLORIDE CRYS ER 10 MEQ PO TBCR
10.0000 meq | EXTENDED_RELEASE_TABLET | Freq: Every day | ORAL | Status: DC
  Administered 2014-07-27: 10 meq via ORAL
  Filled 2014-07-27: qty 1
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
## 2014-07-27 NOTE — ED Notes (Signed)
Poison control updated 

## 2014-07-27 NOTE — Plan of Care (Signed)
Problem: Alteration in mood Goal: LTG-Patient reports reduction in suicidal thoughts (Patient reports reduction in suicidal thoughts and is able to verbalize a safety plan for whenever patient is feeling suicidal)  Outcome: Progressing Patient currently denies suicidal ideations.      

## 2014-07-27 NOTE — Progress Notes (Signed)
[facility] Group Notes:  (Nursing/MHT/Case Management/Adjunct)  Date:  07/27/2014  Time:  9:25 PM  Type of Therapy:  Psychoeducational Skills  Participation Level:  Active  Participation Quality:  Appropriate  Affect:  Appropriate  Cognitive:  Appropriate  Insight:  Appropriate  Engagement in Group:  Engaged  Modes of Intervention:  Discussion  Summary of Progress/Problems:Dallis rated her day at a 9. She said she realized that she does want to live and loves herself and that realization was very positive for her today  Madaline SavageDiamond N Reshawn Ostlund 07/27/2014, 9:25 PM

## 2014-07-27 NOTE — ED Notes (Signed)
Delay in blood draw due to TTS in with patient.

## 2014-07-27 NOTE — Progress Notes (Signed)
Writer spoke with patient and her mother this evening. Mother drove from [place] and visited, she requested that she be informed prior to her daughters discharge. Patient was inquiring if she may discharge by Sunday because it's her birthday. Writer encouraged her to speak with NP/doctor on tomorrow concerning this matter. Patient c/o feeling nauseated and was given a prn zofran. She reported that she felt like she still had the medicine in her system and needed to vomit. Patient attended group this evening and participated. She currently denies si/hia/v/ hallucinations. Support and encouragement given, safety maintained on unit with 15 min checks.

## 2014-07-27 NOTE — Tx Team (Signed)
Initial Interdisciplinary Treatment Plan   PATIENT STRESSORS: Financial difficulties Health problems   PATIENT STRENGTHS: Average or above average intelligence Capable of independent living Supportive family/friends Work skills   PROBLEM LIST: Problem List/Patient Goals Date to be addressed Date deferred Reason deferred Estimated date of resolution  Depressed Modd      Anxiety      Sleep Disturbance      Decreased Appetite      Financial concerns      Decline in Physical Health-- Back and Neck                          DISCHARGE CRITERIA:  Ability to meet basic life and health needs Improved stabilization in mood, thinking, and/or behavior Medical problems require only outpatient monitoring Motivation to continue treatment in a less acute level of care Need for constant or close observation no longer present Safe-care adequate arrangements made Verbal commitment to aftercare and medication compliance  PRELIMINARY DISCHARGE PLAN: Outpatient therapy Return to previous living arrangement  PATIENT/FAMIILY INVOLVEMENT: This treatment plan has been presented to and reviewed with the patient, Debra CellarNauua [name].  The patient and family have been given the opportunity to ask questions and make suggestions.  [name] 07/27/2014, 6:27 PM

## 2014-07-27 NOTE — Plan of Care (Signed)
Problem: Diagnosis: Increased Risk For Suicide Attempt Goal: STG-Patient Will Comply With Medication Regime Outcome: Progressing Patient is compliant with scheduled medication.     

## 2014-07-27 NOTE — ED Notes (Signed)
TTS at bedside. 

## 2014-07-27 NOTE — BH Assessment (Addendum)
[facility] Assessment Progress Note  Per Thedore MinsMojeed Akintayo, MD, this pt requires psychiatric hospitalization at this time.  At 14:12 Christiane [facility]Jonathan [facility].  Pt has been accepted to their facility by Dr. Wendall StadeKohl, Rm 117A.  Please call report to [phone].  Pt is voluntary and [facility].  Nanine MeansJamison Lord, NP concurs with this decision.  Pt's nurse has been notified.  Doylene Canninghomas Keldrick Pomplun, MA Triage Specialist [phone]   Addendum:  This pt, who is currently under voluntary status, [facility]  Nanine MeansJamison Lord, NP, has agreed to this, and Berneice Heinrichina Tate, RN, [facility], [facility] Rm [facility].  Pt has signed Voluntary Admission and Consent for Treatment, as well as Consent to Release Information, and signed [facility].  Pt's nurse, Kendal Hymendie, has been notified, and agrees to send original paperwork along with pt via [facility], and to call report to [phone].  Morrie Sheldonshley at [facility] has been notified of the change of plans.  Doylene Canninghomas Neil Brickell, MA Triage Specialist [phone]
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
## 2014-07-27 NOTE — Plan of Care (Signed)
Problem: Diagnosis: Increased Risk For Suicide Attempt Goal: STG-Patient Will Attend All Groups On The Unit Outcome: Progressing Patient attended evening group on 07/27/14 and participated.

## 2014-07-27 NOTE — ED Notes (Signed)
Pt presents with  OD on Vicodin last night. Approximately 17 tabs. Pt reports she took pills for her chronic back and neck pain, Pt denies previous psych history, Denies SI, HI or AV hallucinations at present. Pt AAO x 3, cooperative and nauseated.  Monitoring for safety, Q 15 min checks in effect.

## 2014-07-27 NOTE — ED Notes (Signed)
[id number] Expected date:  Expected time:  Means of arrival:  Comments: Res A

## 2014-07-27 NOTE — ED Notes (Signed)
Patient given sandwich and drink, sitter remains at bedside.

## 2014-07-28 ENCOUNTER — Encounter (HOSPITAL_COMMUNITY): Payer: Self-pay | Admitting: Psychiatry

## 2014-07-28 DIAGNOSIS — F332 Major depressive disorder, recurrent severe without psychotic features: Principal | ICD-10-CM

## 2014-07-28 MED ORDER — MAGNESIUM CITRATE PO SOLN
1.0000 | Freq: Once | ORAL | Status: AC
  Administered 2014-07-28: 1 via ORAL

## 2014-07-28 MED ORDER — GABAPENTIN 300 MG PO CAPS
300.0000 mg | ORAL_CAPSULE | Freq: Three times a day (TID) | ORAL | Status: DC
  Administered 2014-07-28 – 2014-07-29 (×2): 300 mg via ORAL
  Filled 2014-07-28 (×9): qty 1

## 2014-07-28 MED ORDER — GABAPENTIN 300 MG PO CAPS
300.0000 mg | ORAL_CAPSULE | Freq: Every day | ORAL | Status: DC
  Filled 2014-07-28 (×2): qty 1

## 2014-07-28 MED ORDER — HYDROXYZINE HCL 25 MG PO TABS
25.0000 mg | ORAL_TABLET | ORAL | Status: DC | PRN
  Filled 2014-07-28: qty 10

## 2014-07-28 MED ORDER — MIRTAZAPINE 7.5 MG PO TABS
7.5000 mg | ORAL_TABLET | Freq: Every day | ORAL | Status: DC
  Administered 2014-07-28: 7.5 mg via ORAL
  Filled 2014-07-28 (×4): qty 1

## 2014-07-28 NOTE — [facility] Group Notes (Signed)
[facility] LCSW Group Therapy  07/28/2014  1:15 PM  Type of Therapy:  Group Therapy  Participation Level:  Active  Participation Quality:  Attentive and Sharing  Affect:  Appropriate  Cognitive:  Appropriate  Insight:  Developing/Improving  Engagement in Therapy:  Developing/Improving  Modes of Intervention:  Clarification, Discussion, Rapport Building, Socialization and Support  Summary of Progress/Problems: Summary of Progress/Problems: The main focus of today's process group was to learn how to use a decisional balance exercise to move forward in the Stages of Change, which were described and discussed. Motivational Interviewing and a worksheet were utilized to help patients explore in depth the perceived benefits and costs of unhealthy coping techniques, as well as the benefits and costs of replacing that with a healthy coping skills. [place] entered group at midpoint yet was attentive and appeared to engage easily. Patient shared that she identified with lack of self care, specifically non compliance with medications and shared her willingness to put herself first and get to medication appointments and therapy.   Clide DalesHarrill, Catherine Campbell
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
## 2014-07-28 NOTE — Progress Notes (Signed)
Writer spoke with patient and her mother who was visiting and she reports that she will be discharging on her birthday tomorrow. She was smiling and is looking forward to leaving on tomorrow. She informed Clinical research associatewriter of belongings brought in and was told that once searched she would receive them. She denies si/hi/a/v hallucinations. She plans to attend group this evening. Support and encouragement given, safety maintained on unit with 15 min checks.
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
## 2014-07-28 NOTE — BHH Group Notes (Signed)
BHH Group Notes:  (Nursing/MHT/Case Management/Adjunct)  Date:  07/28/2014  Time:  1:35 PM  Type of Therapy: Goals Group: yhe focus of this group is to educate patients on how to set healthy goals as well as identify skills needed to accomplish these goals.  Participation Level:  Active  Participation Quality:  Appropriate  Affect:  Appropriate  Cognitive:  Alert  Insight:  Appropriate  Engagement in Group:  Engaged  Modes of Intervention:  Education  Summary of Progress/Problems:  Rich BraveDuke, Quamesha Mullet Lynn 07/28/2014, 1:35 PM

## 2014-07-28 NOTE — Progress Notes (Signed)
Adult Psychoeducational Group Note  Date:  07/28/2014 Time:  9:11 PM  Group Topic/Focus:  Wrap-Up Group:   The focus of this group is to help patients review their daily goal of treatment and discuss progress on daily workbooks.  Participation Level:  Active  Participation Quality:  Appropriate and Attentive  Affect:  Appropriate  Cognitive:  Appropriate  Insight: Appropriate  Engagement in Group:  Engaged  Modes of Intervention:  Discussion  Additional Comments:  Pt stated her goal for today was to have a positive mindset and get his discharge plan set up. Pt was able to accomplish this. Pt stated her goal for tomorrow is to get discharged.  Caswell CorwinOwen, Ephrata Verville C 07/28/2014, 9:11 PM

## 2014-07-28 NOTE — Progress Notes (Addendum)
Psychoeducational Group Note  Date:  07/1502016 Time: 1015  Group Topic/Focus:  Identifying Needs:   The focus of this group is to help patients identify their personal needs that have been historically problematic and identify healthy behaviors to address their needs.  Participation Level:  Active  Participation Quality: good  Affect: flat  Cognitive:  intact  Insight:  good  Engagement in Group: engaged  Additional Comments: Pt was engaged in group, asked appropriate questions, shared personal life experience with the group and demonstrates willingness to process and understand her problems. PDuke RN [facility]
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
## 2014-07-29 DIAGNOSIS — F332 Major depressive disorder, recurrent severe without psychotic features: Secondary | ICD-10-CM | POA: Insufficient documentation

## 2014-07-29 MED ORDER — NICOTINE POLACRILEX 2 MG MT GUM: 2.0000 mg | CHEWING_GUM | OROMUCOSAL | Status: AC | PRN

## 2014-07-29 MED ORDER — MIRTAZAPINE 7.5 MG PO TABS: 7.5000 mg | ORAL_TABLET | Freq: Every day | ORAL | Status: DC

## 2014-07-29 MED ORDER — FLUOXETINE HCL 10 MG PO CAPS: 10.0000 mg | ORAL_CAPSULE | Freq: Every day | ORAL | Status: DC

## 2014-07-29 MED ORDER — ALBUTEROL SULFATE HFA 108 (90 BASE) MCG/ACT IN AERS
1.0000 | INHALATION_SPRAY | Freq: Four times a day (QID) | RESPIRATORY_TRACT | Status: AC | PRN
Start: 2014-07-29 — End: ?

## 2014-07-29 MED ORDER — BECLOMETHASONE DIPROPIONATE 80 MCG/ACT IN AERS: 2.0000 | INHALATION_SPRAY | Freq: Two times a day (BID) | RESPIRATORY_TRACT | Status: AC | PRN

## 2014-07-29 MED ORDER — HYDROXYZINE HCL 25 MG PO TABS: 25.0000 mg | ORAL_TABLET | ORAL | Status: DC | PRN

## 2014-07-29 MED ORDER — NORETHIN ACE-ETH ESTRAD-FE 1.5-30 MG-MCG PO TABS: 1.0000 | ORAL_TABLET | Freq: Every day | ORAL | Status: DC

## 2014-07-29 MED ORDER — NAPROXEN 500 MG PO TABS: 500.0000 mg | ORAL_TABLET | Freq: Two times a day (BID) | ORAL | Status: AC | PRN

## 2014-07-29 MED ORDER — GABAPENTIN 300 MG PO CAPS: 300.0000 mg | ORAL_CAPSULE | Freq: Three times a day (TID) | ORAL | Status: DC
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
## 2014-07-29 NOTE — Progress Notes (Signed)
  [facility] :  Will you be returning to the same living situation after discharge:  No.  Going to new apartment At discharge, do you have transportation home?: Yes,  mother Do you have the ability to pay for your medications: Yes,  insurance  Release of information consent forms completed and in the chart;  Patient's signature needed at discharge.  Patient to Follow up at: Follow-up Information    Go to [facility].   Why:  For medication management, [facility] any day Mon-Fri between 8am-3pm.  Expect to be there for several hours, as this is a long process involving several assessments.   Contact information:   [facility] [address] [place]  [id number] Telephone:  [phone]      Call Particia JasperAlyse Pait, [facility].   Why:  A message has been left requesting an appointment for you.  Please call them to follow up about date and time of the appointment.   Contact information:   [facility] [address] [place]  [id number] Telephone:  [phone]      Patient denies SI/HI: Yes,  see doctor note     Safety Planning and Suicide Prevention discussed: Yes,  with mother and with patient  Have you used any form of tobacco in the last 30 days? (Cigarettes, Smokeless Tobacco, Cigars, and/or Pipes): Yes  Has patient been referred to the [facility]uit[facility]?: Yes, faxed on 07/28/14  Sarina SerGrossman-Orr, Donnis Pecha Jo 07/29/2014, 3:05 PM

## 2014-07-29 NOTE — Progress Notes (Signed)
Psychoeducational Group Note  Date:  07/29/2014 Time:   1400  Group Topic/Focus:  Life SKills The focus of the group is to help patients develop healthier coping skills.   Participation Level:  good  Participation Quality:good    Affect: good   Cognitive:  intact  Insight:  good  Engagement in Group:    Additional Comments:

## 2014-07-29 NOTE — [facility] Suicide Risk Assessment (Signed)
[facility] INPATIENT:  Family/Significant Other Suicide Prevention Education  Suicide Prevention Education:  Education Completed; Dorice LamasShenika Cocozza, mother [phone], has been identified by the patient as the family member/significant other with whom the patient will be residing, and identified as the person(s) who will aid the patient in the event of a mental health crisis (suicidal ideations/suicide attempt).  With written consent from the patient, the family member/significant other has been provided the following suicide prevention education, prior to the discharge of the patient, in the lobby while awaiting her discharge  The suicide prevention education provided includes the following:  Suicide risk factors  Suicide prevention and interventions  National Suicide Hotline telephone number  [facility] assessment telephone number  [facility] Emergency Assistance 911  [facility] and/or Residential Mobile Crisis Unit telephone number  Request made of family/significant other to:  Remove weapons (e.g., guns, rifles, knives), all items previously/currently identified as safety concern.    Remove drugs/medications (over-the-counter, prescriptions, illicit drugs), all items previously/currently identified as a safety concern.  The family member/significant other verbalizes understanding of the suicide prevention education information provided.  The family member/significant other agrees to remove the items of safety concern listed above.  Sarina SerGrossman-Orr, Lilee Aldea Jo 07/29/2014, 3:04 PM
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
## 2014-09-05 ENCOUNTER — Telehealth: Payer: Self-pay | Admitting: *Deleted

## 2014-09-05 NOTE — Telephone Encounter (Signed)
Caller requested a refill on fluoxetine.  This medicine was not prescribed here. I called her and informed her that she would need to call the prescriber or have the pharmacy call the prescriber.  Patient voiced understanding.

## 2014-09-05 NOTE — Telephone Encounter (Signed)
This patient has to transition to family medicine or internal medicine. She has been given contacts repeatedly. She is 18 yrs old & can no longer be seen here as she does not have any special needs or delays that need to be accommodated in this clinic. Dr Marina Goodell had also made her aware of transitioning.  Please advice patient if she calls again. Thank you.  Tobey Bride, MD Pediatrician [facility] [address] [place], [place] [address] Ph: [phone] Fax: [phone] 09/05/2014 2:47 PM

## 2014-10-16 ENCOUNTER — Ambulatory Visit (INDEPENDENT_AMBULATORY_CARE_PROVIDER_SITE_OTHER): Payer: 59 | Admitting: Family Medicine

## 2014-10-16 ENCOUNTER — Encounter: Payer: Self-pay | Admitting: Family Medicine

## 2014-10-16 VITALS — BP 125/77 | HR 93 | Temp 98.2°F | Resp 16 | Ht 65.0 in | Wt 145.0 lb

## 2014-10-16 DIAGNOSIS — N912 Amenorrhea, unspecified: Secondary | ICD-10-CM

## 2014-10-16 DIAGNOSIS — Z202 Contact with and (suspected) exposure to infections with a predominantly sexual mode of transmission: Secondary | ICD-10-CM

## 2014-10-16 DIAGNOSIS — F411 Generalized anxiety disorder: Secondary | ICD-10-CM

## 2014-10-16 DIAGNOSIS — Z8709 Personal history of other diseases of the respiratory system: Secondary | ICD-10-CM

## 2014-10-16 DIAGNOSIS — R5383 Other fatigue: Secondary | ICD-10-CM | POA: Diagnosis not present

## 2014-10-16 DIAGNOSIS — R1013 Epigastric pain: Secondary | ICD-10-CM

## 2014-10-16 DIAGNOSIS — K219 Gastro-esophageal reflux disease without esophagitis: Secondary | ICD-10-CM

## 2014-10-16 DIAGNOSIS — F332 Major depressive disorder, recurrent severe without psychotic features: Secondary | ICD-10-CM | POA: Diagnosis not present

## 2014-10-16 LAB — POCT URINE PREGNANCY: Preg Test, Ur: NEGATIVE

## 2014-10-16 LAB — COMPLETE METABOLIC PANEL WITH GFR
ALT: 12 U/L (ref 6–29)
AST: 14 U/L (ref 10–30)
Albumin: 4.4 g/dL (ref 3.6–5.1)
Alkaline Phosphatase: 58 U/L (ref 33–115)
BUN: 7 mg/dL (ref 7–25)
CO2: 26 mmol/L (ref 20–31)
Calcium: 9.6 mg/dL (ref 8.6–10.2)
Chloride: 104 mmol/L (ref 98–110)
Creat: 0.74 mg/dL (ref 0.50–1.10)
GFR, Est African American: >89 mL/min (ref >=60)
GFR, Est Non African American: >89 mL/min (ref >=60)
Glucose, Bld: 65 mg/dL (ref 65–99)
Potassium: 3.6 mmol/L (ref 3.5–5.3)
Sodium: 139 mmol/L (ref 135–146)
Total Bilirubin: 0.4 mg/dL (ref 0.2–1.2)
Total Protein: 6.7 g/dL (ref 6.1–8.1)

## 2014-10-16 LAB — CBC WITH DIFFERENTIAL/PLATELET
BASOS ABS: 0 10*3/uL (ref 0.0–0.1)
BASOS PCT: 1 % (ref 0–1)
EOS ABS: 0 10*3/uL (ref 0.0–0.7)
EOS PCT: 1 % (ref 0–5)
HEMATOCRIT: 38.6 % (ref 36.0–46.0)
Hemoglobin: 13 g/dL (ref 12.0–15.0)
Lymphocytes Relative: 32 % (ref 12–46)
Lymphs Abs: 1.4 10*3/uL (ref 0.7–4.0)
MCH: 27.9 pg (ref 26.0–34.0)
MCHC: 33.7 g/dL (ref 30.0–36.0)
MCV: 82.8 fL (ref 78.0–100.0)
MPV: 9.5 fL (ref 8.6–12.4)
Monocytes Absolute: 0.3 10*3/uL (ref 0.1–1.0)
Monocytes Relative: 8 % (ref 3–12)
Neutro Abs: 2.5 10*3/uL (ref 1.7–7.7)
Neutrophils Relative %: 58 % (ref 43–77)
Platelets: 424 10*3/uL — ABNORMAL HIGH (ref 150–400)
RBC: 4.66 MIL/uL (ref 3.87–5.11)
RDW: 14.5 % (ref 11.5–15.5)
WBC: 4.3 10*3/uL (ref 4.0–10.5)

## 2014-10-16 LAB — POCT URINALYSIS DIP (DEVICE)
BILIRUBIN URINE: NEGATIVE
Glucose, UA: NEGATIVE mg/dL
KETONES UR: NEGATIVE mg/dL
Leukocytes, UA: NEGATIVE
Nitrite: NEGATIVE
PH: 7 (ref 5.0–8.0)
PROTEIN: NEGATIVE mg/dL
SPECIFIC GRAVITY, URINE: 1.025 (ref 1.005–1.030)
Urobilinogen, UA: 0.2 mg/dL (ref 0.0–1.0)

## 2014-10-16 LAB — TSH: TSH: 0.5 u[IU]/mL (ref 0.350–4.500)

## 2014-10-16 NOTE — Patient Instructions (Signed)
Helicobacter Pylori Antibodies Test This is a blood test which looks for a germ called Helicobacter pylori. This can also be diagnosed by a breath test or a microscopic examination of a portion (biopsy) of the small bowel. H. pylori is a germ that is found in the cells that line the stomach. It is a risk factor for stomach and small bowel ulcers, long-standing inflammation of the lining of the stomach, or even ulcers that may occur in the esophagus (the canal that runs from the mouth to the stomach). This bacterium is also a factor in stomach cancer. The amount of the bacteria is found in about 10% of healthy persons younger than 21 years of age and the amount of the bacteria increases with age. Most persons with these bacteria have no symptoms; however, it is thought that when these bacteria cause ulcers, antibiotic medications can be used to help eliminate or reduce the problem.  PREPARATION FOR TEST No preparation or fasting is necessary. NORMAL FINDINGS Negative (H. pylori bacteria not present). Ranges for normal findings may vary among different laboratories and hospitals. You should always check with your doctor after having lab work or other tests done to discuss the meaning of your test results and whether or not your values are considered within normal limits. MEANING OF TEST  Your caregiver will go over the test results with you and discuss the importance and meaning of your results, as well as treatment options and the need for additional tests if necessary. OBTAINING THE TEST RESULTS It is your responsibility to obtain your test results. Ask the lab or department performing the test when and how you will get your results. Document Released: 01/23/2004 Document Revised: 05/15/2013 Document Reviewed: 12/10/2007 [facility] Patient Information 2015 [place], [place]. This information is not intended to replace advice given to you by your health care provider. Make sure you discuss any questions you  have with your health care provider. Food Choices for Gastroesophageal Reflux Disease When you have gastroesophageal reflux disease (GERD), the foods you eat and your eating habits are very important. Choosing the right foods can help ease the discomfort of GERD. WHAT GENERAL GUIDELINES DO I NEED TO FOLLOW?  Choose fruits, vegetables, whole grains, low-fat dairy products, and low-fat meat, fish, and poultry.  Limit fats such as oils, salad dressings, butter, nuts, and avocado.  Keep a food diary to identify foods that cause symptoms.  Avoid foods that cause reflux. These may be different for different people.  Eat frequent small meals instead of three large meals each day.  Eat your meals slowly, in a relaxed setting.  Limit fried foods.  Cook foods using methods other than frying.  Avoid drinking alcohol.  Avoid drinking large amounts of liquids with your meals.  Avoid bending over or lying down until 2-3 hours after eating. WHAT FOODS ARE NOT RECOMMENDED? The following are some foods and drinks that may worsen your symptoms: Vegetables Tomatoes. Tomato juice. Tomato and spaghetti sauce. Chili peppers. Onion and garlic. Horseradish. Fruits Oranges, grapefruit, and lemon (fruit and juice). Meats High-fat meats, fish, and poultry. This includes hot dogs, ribs, ham, sausage, salami, and bacon. Dairy Whole milk and chocolate milk. Sour cream. Cream. Butter. Ice cream. Cream cheese.  Beverages Coffee and tea, with or without caffeine. Carbonated beverages or energy drinks. Condiments Hot sauce. Barbecue sauce.  Sweets/Desserts Chocolate and cocoa. Donuts. Peppermint and spearmint. Fats and Oils High-fat foods, including [place] fries and potato chips. Other Vinegar. Strong spices, such as black pepper,  white pepper, red pepper, cayenne, curry powder, cloves, ginger, and chili powder. The items listed above may not be a complete list of foods and beverages to avoid. Contact  your dietitian for more information. Document Released: 12/29/2004 Document Revised: 01/03/2013 Document Reviewed: 11/02/2012 [facility] Patient Information 2015 [place], [place]. This information is not intended to replace advice given to you by your health care provider. Make sure you discuss any questions you have with your health care provider.
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
## 2014-10-17 ENCOUNTER — Telehealth: Payer: Self-pay | Admitting: Family Medicine

## 2014-10-17 LAB — GC/CHLAMYDIA PROBE AMP
CT Probe RNA: NEGATIVE
GC PROBE AMP APTIMA: NEGATIVE

## 2014-10-17 LAB — RPR

## 2014-10-17 LAB — HIV ANTIBODY (ROUTINE TESTING W REFLEX): HIV 1&2 Ab, 4th Generation: NONREACTIVE

## 2014-10-17 MED ORDER — OMEPRAZOLE 40 MG PO CPDR: 40.0000 mg | DELAYED_RELEASE_CAPSULE | Freq: Every day | ORAL | Status: DC

## 2014-10-17 NOTE — Telephone Encounter (Signed)
Reviewed labs. Notified patient to discuss lab results. Will continue a 6 week trial of Omeprazole 40 mg daily. Patient will follow up as scheduled.   Massie Maroon, FNP

## 2014-10-24 IMAGING — US US PELVIS COMPLETE
1 series · 14 of 25 positions shown · non-contrast
Comparison: None

CLINICAL DATA: Pelvic tenderness.



[Series 1: us pelvis complete · 0.25mm/px · 14 of 51 slices shown]
[im 1/51]
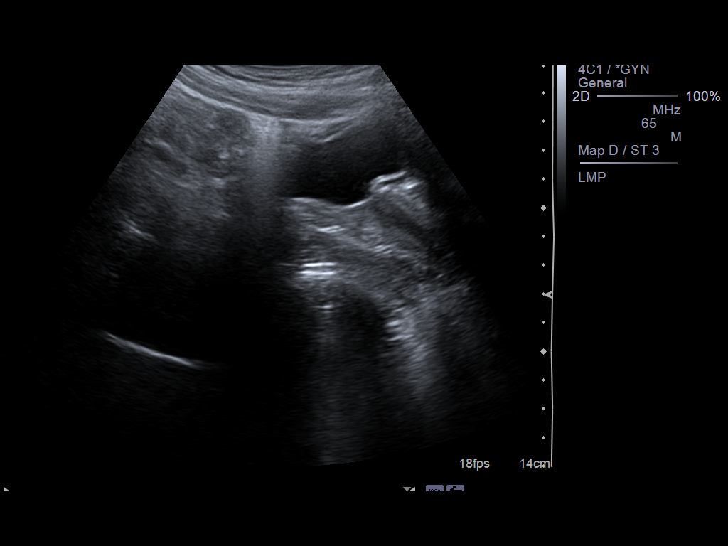
[im 5/51]
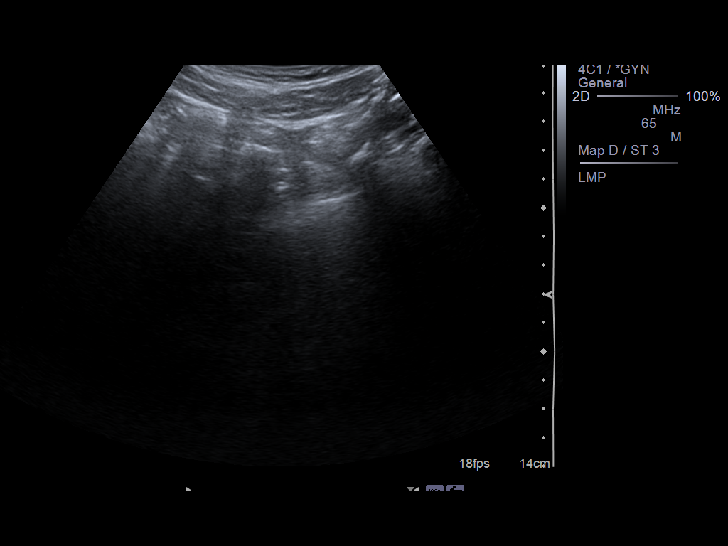
[im 9/51]
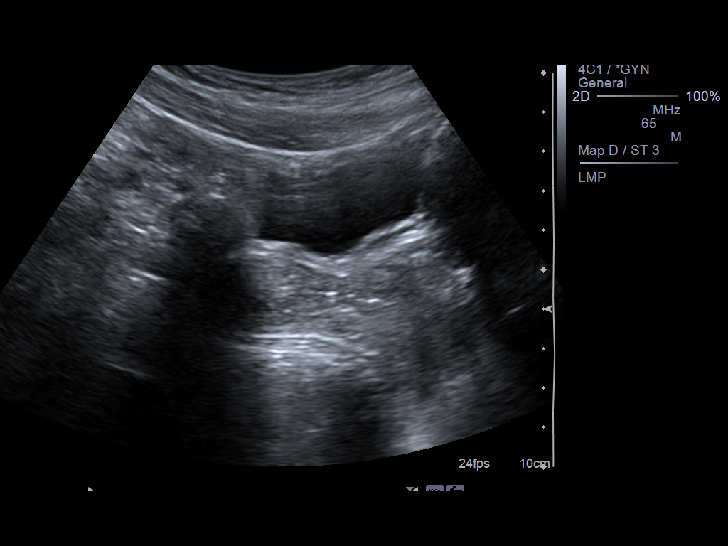
[im 13/51]
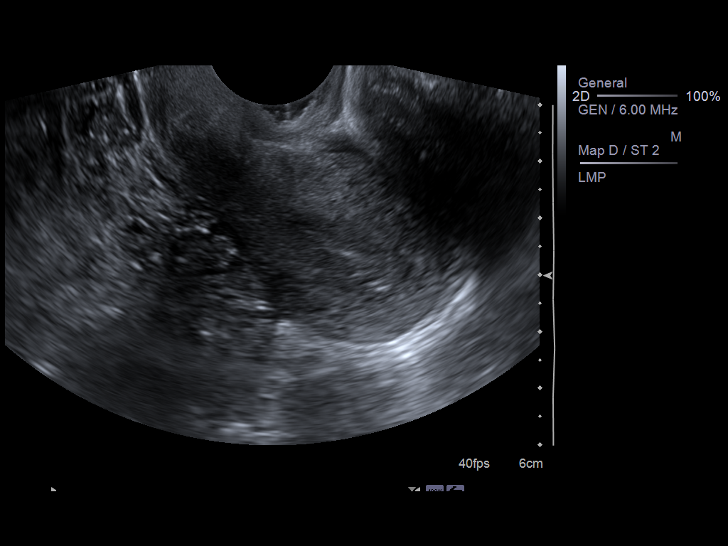
[im 17/51]
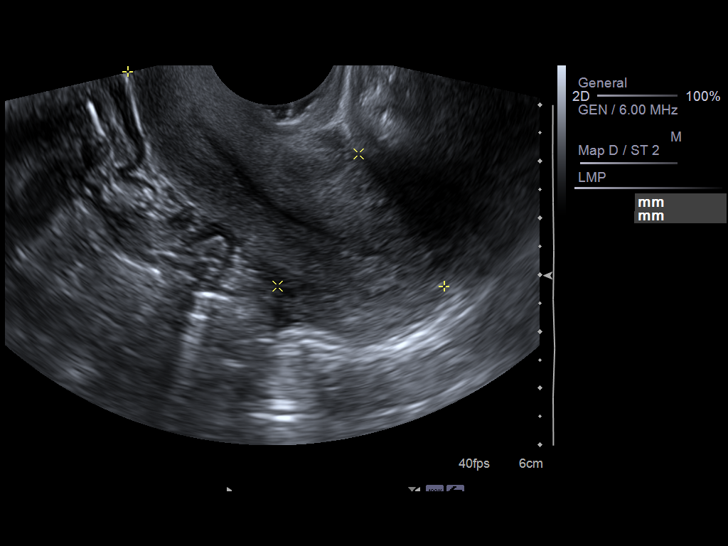
[im 19/51]
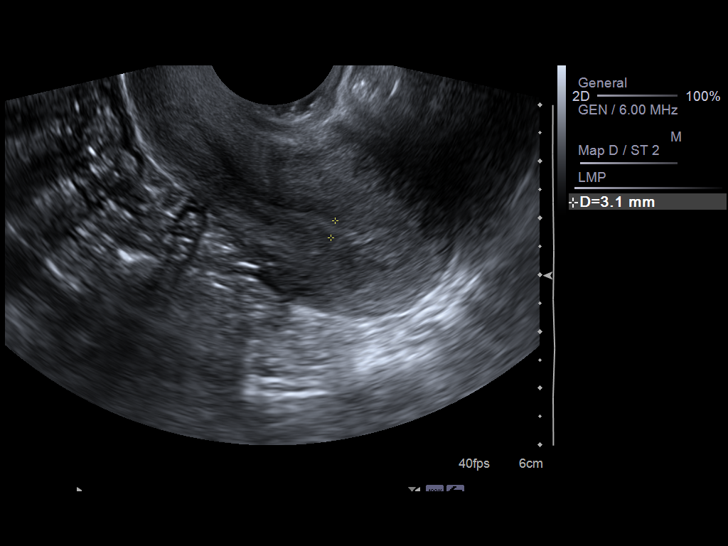
[im 23/51]
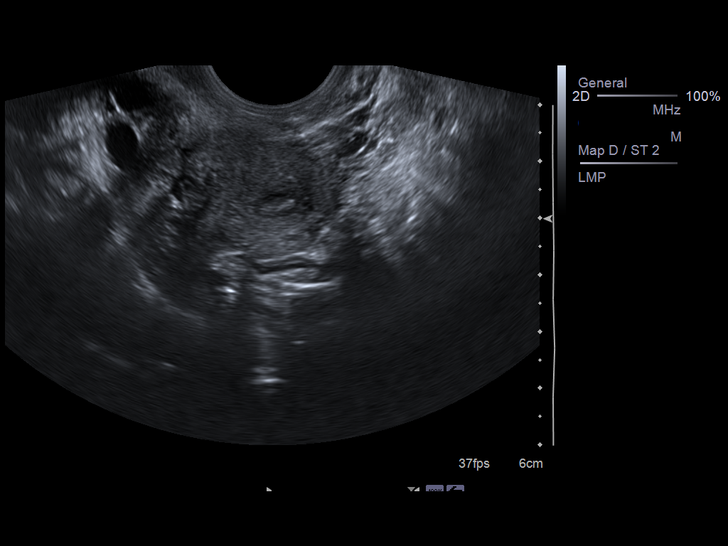
[im 28/51]
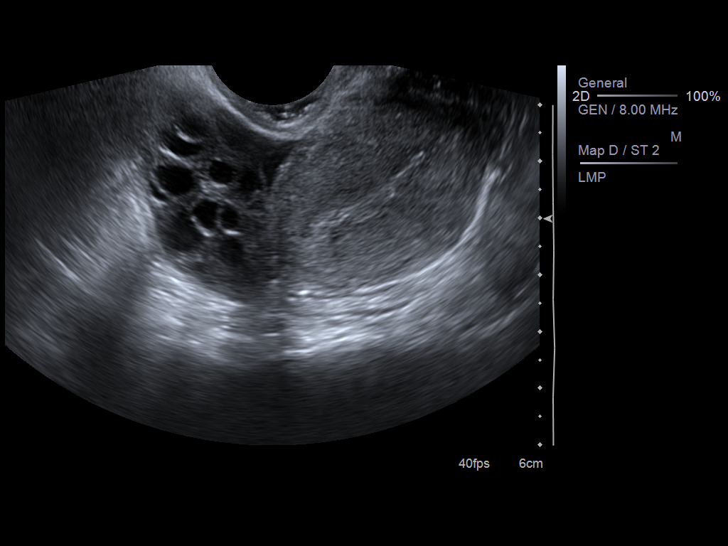
[im 32/51]
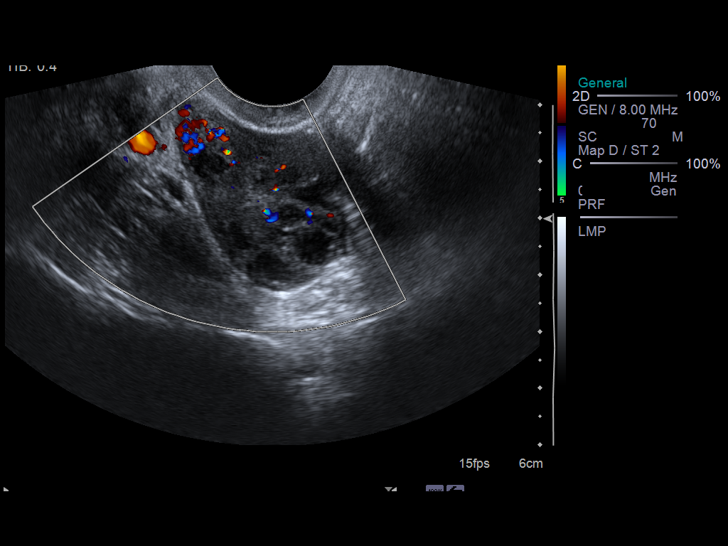
[im 34/51]
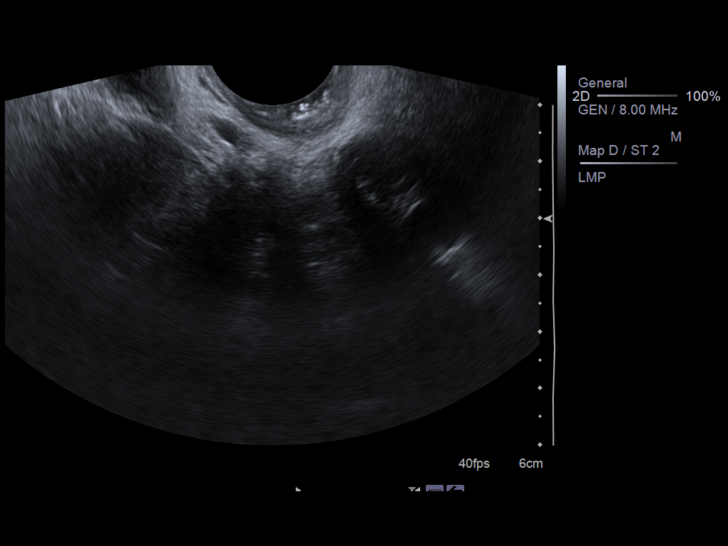
[im 38/51]
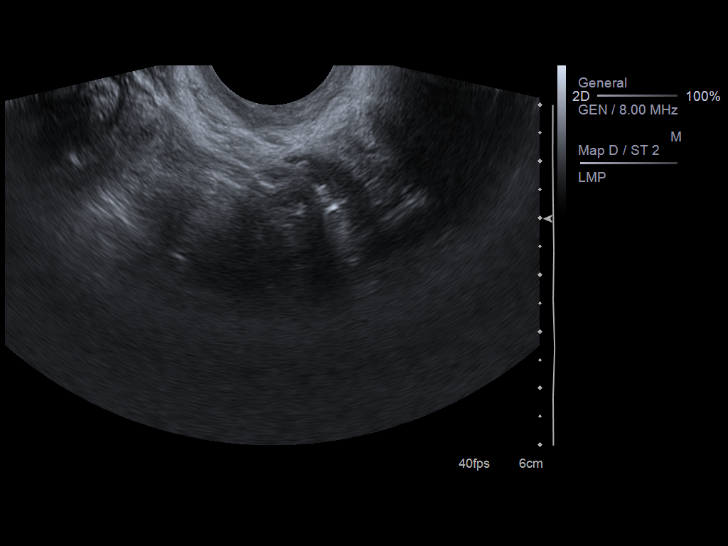
[im 42/51]
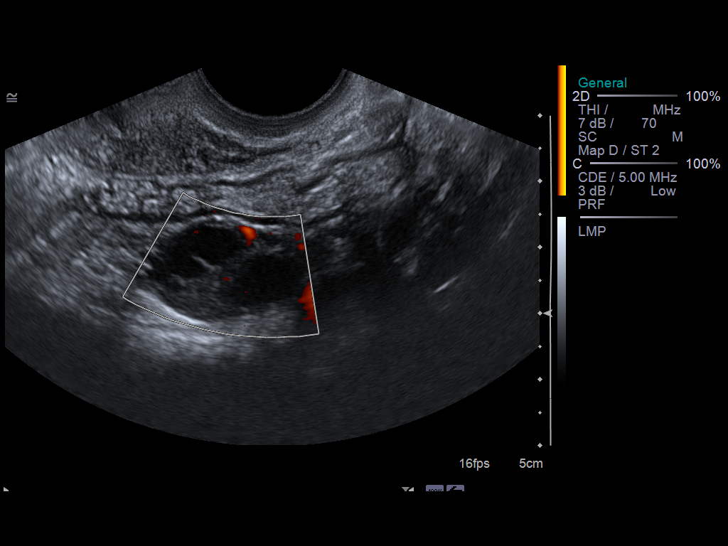
[im 46/51]
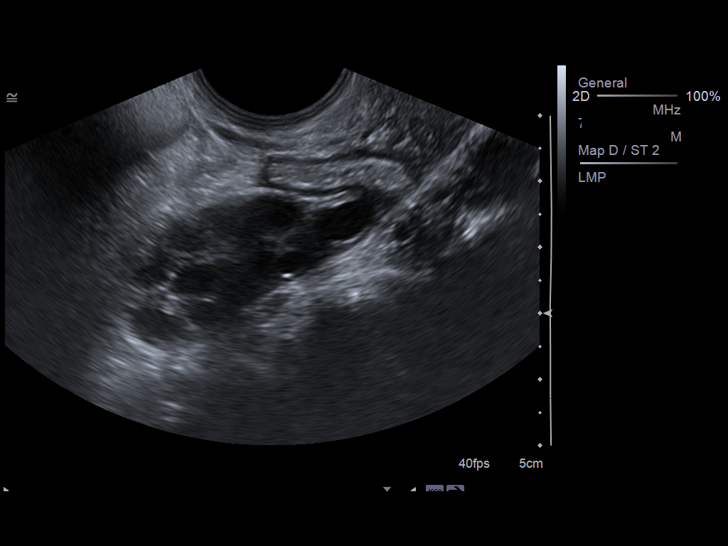
[im 51/51]
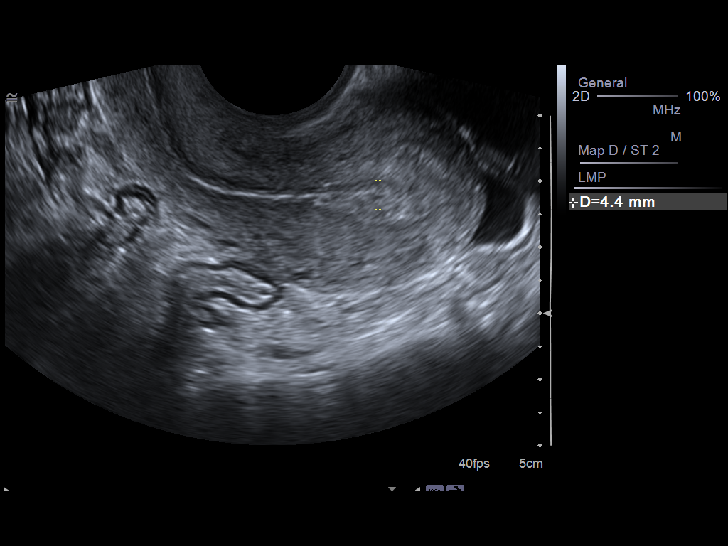

[14 of 25 positions shown; findings below may reference images not displayed]

FINDINGS: Uterus: Normal in size and appearance

Endometrium: Normal in thickness and appearance

Right ovary:  Normal appearance/no adnexal mass

Left ovary: Normal appearance/no adnexal mass

Other findings: No free fluid
IMPRESSION: Normal study. No evidence of pelvic mass or other significant
abnormality.

## 2014-11-27 ENCOUNTER — Ambulatory Visit (INDEPENDENT_AMBULATORY_CARE_PROVIDER_SITE_OTHER): Payer: 59 | Admitting: Family Medicine

## 2014-11-27 ENCOUNTER — Other Ambulatory Visit: Payer: Self-pay | Admitting: Family Medicine

## 2014-11-27 VITALS — BP 139/73 | HR 88 | Temp 98.4°F | Resp 14 | Ht 65.0 in | Wt 146.0 lb

## 2014-11-27 DIAGNOSIS — R1084 Generalized abdominal pain: Secondary | ICD-10-CM | POA: Diagnosis not present

## 2014-11-27 DIAGNOSIS — K219 Gastro-esophageal reflux disease without esophagitis: Secondary | ICD-10-CM | POA: Diagnosis not present

## 2014-11-27 MED ORDER — OMEPRAZOLE 40 MG PO CPDR: 40.0000 mg | DELAYED_RELEASE_CAPSULE | Freq: Every day | ORAL | Status: AC
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
## 2014-11-27 NOTE — Patient Instructions (Signed)

## 2014-11-28 ENCOUNTER — Encounter: Payer: Self-pay | Admitting: Family Medicine

## 2014-11-28 LAB — OVA AND PARASITE EXAMINATION: OP: NONE SEEN

## 2014-11-29 ENCOUNTER — Telehealth: Payer: Self-pay

## 2014-11-29 NOTE — Telephone Encounter (Signed)
Called patient and advised of negative ova and parasite. Explained to patient we would notify whenever other results come in. Thanks!

## 2014-11-29 NOTE — Telephone Encounter (Signed)
-----   Message from Massie MaroonLachina M Hollis, [place]NP sent at 11/28/2014  7:17 PM EST ----- Regarding: lab results Please inform patient that ova and parasite results were negative. We are awaiting H. Pylori results and will contact her as soon as they come available.   Thanks! ----- Message -----    From: Lab in [facility] Interface    Sent: 11/28/2014   3:59 PM      To: Massie MaroonLachina M Hollis, FNP

## 2014-12-01 LAB — HELICOBACTER PYLORI  SPECIAL ANTIGEN: H. PYLORI Antigen: NOT DETECTED

## 2014-12-11 ENCOUNTER — Other Ambulatory Visit: Payer: Self-pay | Admitting: Family Medicine

## 2014-12-11 ENCOUNTER — Encounter: Payer: Self-pay | Admitting: Family Medicine

## 2014-12-11 DIAGNOSIS — K219 Gastro-esophageal reflux disease without esophagitis: Secondary | ICD-10-CM

## 2014-12-11 DIAGNOSIS — K589 Irritable bowel syndrome without diarrhea: Secondary | ICD-10-CM

## 2014-12-12 ENCOUNTER — Encounter: Payer: Self-pay | Admitting: Gastroenterology

## 2014-12-17 ENCOUNTER — Encounter: Payer: Self-pay | Admitting: Family Medicine

## 2014-12-17 ENCOUNTER — Ambulatory Visit (INDEPENDENT_AMBULATORY_CARE_PROVIDER_SITE_OTHER): Payer: 59 | Admitting: Family Medicine

## 2014-12-17 VITALS — BP 128/76 | HR 100 | Temp 98.2°F | Resp 16 | Ht 65.0 in | Wt 145.0 lb

## 2014-12-17 DIAGNOSIS — F332 Major depressive disorder, recurrent severe without psychotic features: Secondary | ICD-10-CM | POA: Diagnosis not present

## 2014-12-17 DIAGNOSIS — Z309 Encounter for contraceptive management, unspecified: Secondary | ICD-10-CM

## 2014-12-17 DIAGNOSIS — Z3041 Encounter for surveillance of contraceptive pills: Secondary | ICD-10-CM | POA: Diagnosis not present

## 2014-12-17 DIAGNOSIS — F411 Generalized anxiety disorder: Secondary | ICD-10-CM

## 2014-12-17 DIAGNOSIS — J4530 Mild persistent asthma, uncomplicated: Secondary | ICD-10-CM

## 2014-12-17 LAB — POCT URINE PREGNANCY: PREG TEST UR: NEGATIVE

## 2014-12-17 MED ORDER — MIRTAZAPINE 7.5 MG PO TABS: 7.5000 mg | ORAL_TABLET | Freq: Every day | ORAL | Status: DC

## 2014-12-17 MED ORDER — NORGESTIMATE-ETH ESTRADIOL 0.25-35 MG-MCG PO TABS: 1.0000 | ORAL_TABLET | Freq: Every day | ORAL | Status: AC

## 2014-12-17 MED ORDER — MONTELUKAST SODIUM 10 MG PO TABS: 10.0000 mg | ORAL_TABLET | Freq: Every day | ORAL | Status: AC

## 2014-12-17 MED ORDER — BUPROPION HCL ER (XL) 150 MG PO TB24: 150.0000 mg | ORAL_TABLET | Freq: Every day | ORAL | Status: DC

## 2014-12-17 NOTE — Patient Instructions (Signed)
Oral Contraception Information Oral contraceptive pills (OCPs) are medicines taken to prevent pregnancy. OCPs work by preventing the ovaries from releasing eggs. The hormones in OCPs also cause the cervical mucus to thicken, preventing the sperm from entering the uterus. The hormones also cause the uterine lining to become thin, not allowing a fertilized egg to attach to the inside of the uterus. OCPs are highly effective when taken exactly as prescribed. However, OCPs do not prevent sexually transmitted diseases (STDs). Safe sex practices, such as using condoms along with the pill, can help prevent STDs.  Before taking the pill, you may have a physical exam and Pap test. Your health care provider may order blood tests. The health care provider will make sure you are a good candidate for oral contraception. Discuss with your health care provider the possible side effects of the OCP you may be prescribed. When starting an OCP, it can take 2 to 3 months for the body to adjust to the changes in hormone levels in your body.  TYPES OF ORAL CONTRACEPTION  The combination pill--This pill contains estrogen and progestin (synthetic progesterone) hormones. The combination pill comes in 21-day, 28-day, or 91-day packs. Some types of combination pills are meant to be taken continuously (365-day pills). With 21-day packs, you do not take pills for 7 days after the last pill. With 28-day packs, the pill is taken every day. The last 7 pills are without hormones. Certain types of pills have more than 21 hormone-containing pills. With 91-day packs, the first 84 pills contain both hormones, and the last 7 pills contain no hormones or contain estrogen only.  The minipill--This pill contains the progesterone hormone only. The pill is taken every day continuously. It is very important to take the pill at the same time each day. The minipill comes in packs of 28 pills. All 28 pills contain the hormone.  ADVANTAGES OF ORAL  CONTRACEPTIVE PILLS  Decreases premenstrual symptoms.   Treats menstrual period cramps.   Regulates the menstrual cycle.   Decreases a heavy menstrual flow.   May treatacne, depending on the type of pill.   Treats abnormal uterine bleeding.   Treats polycystic ovarian syndrome.   Treats endometriosis.   Can be used as emergency contraception.  THINGS THAT CAN MAKE ORAL CONTRACEPTIVE PILLS LESS EFFECTIVE OCPs can be less effective if:   You forget to take the pill at the same time every day.   You have a stomach or intestinal disease that lessens the absorption of the pill.   You take OCPs with other medicines that make OCPs less effective, such as antibiotics, certain HIV medicines, and some seizure medicines.   You take expired OCPs.   You forget to restart the pill on day 7, when using the packs of 21 pills.  RISKS ASSOCIATED WITH ORAL CONTRACEPTIVE PILLS  Oral contraceptive pills can sometimes cause side effects, such as:  Headache.  Nausea.  Breast tenderness.  Irregular bleeding or spotting. Combination pills are also associated with a small increased risk of:  Blood clots.  Heart attack.  Stroke.   This information is not intended to replace advice given to you by your health care provider. Make sure you discuss any questions you have with your health care provider.   Document Released: 03/21/2002 Document Revised: 10/19/2012 Document Reviewed: 06/19/2012 Elsevier Interactive Patient Education 2016 [employer] Acne Acne is a skin problem that causes pimples. Acne occurs when the pores in the skin get blocked. The pores may become  infected with bacteria, or they may become red, sore, and swollen. Acne is a common skin problem, especially for teenagers. Acne usually goes away over time. CAUSES Each pore contains an oil gland. Oil glands make an oily substance that is called sebum. Acne happens when these glands get plugged with sebum, dead  skin cells, and dirt. Then, the bacteria that are normally found in the oil glands multiply and cause inflammation. Acne is commonly triggered by changes in your hormones. These hormonal changes can cause the oil glands to get bigger and to make more sebum. Factors that can make acne worse include:  Hormone changes during:  Adolescence.  Women's menstrual cycles.  Pregnancy.  Oil-based cosmetics and hair products.  Harshly scrubbing the skin.  Strong soaps.  Stress.  Hormone problems that are due to certain diseases.  Long or oily hair rubbing against the skin.  Certain medicines.  Pressure from headbands, backpacks, or shoulder pads.  Exposure to certain oils and chemicals. RISK FACTORS This condition is more likely to develop in:  Teenagers.  People who have a family history of acne. SYMPTOMS Acne often occurs on the face, neck, chest, and upper back. Symptoms include:  Small, red bumps (pimples or papules).  Whiteheads.  Blackheads.  Small, pus-filled pimples (pustules).  Big, red pimples or pustules that feel tender. More severe acne can cause:  An infected area that contains a collection of pus (abscess).  Hard, painful, fluid-filled sacs (cysts).  Scars. DIAGNOSIS This condition is diagnosed with a medical history and physical exam. Blood tests may also be done. TREATMENT Treatment for this condition can vary depending on the severity of your acne. Treatment may include:  Creams and lotions that prevent oil glands from clogging.  Creams and lotions that treat or prevent infections and inflammation.  Antibiotic medicines that are applied to the skin or taken as a pill.  Pills that decrease sebum production.  Birth control pills.  Light or laser treatments.  Surgery.  Injections of medicine into the affected areas.  Chemicals that cause peeling of the skin. Your health care provider will also recommend the best way to take care of your  skin. Good skin care is the most important part of treatment. HOME CARE INSTRUCTIONS Skin Care Take care of your skin as told by your health care provider. You may be told to do these things:  Wash your skin gently at least two times each day, as well as:  After you exercise.  Before you go to bed.  Use mild soap.  Apply a water-based skin moisturizer after you wash your skin.  Use a sunscreen or sunblock with SPF 30 or greater. This is especially important if you are using acne medicines.  Choose cosmetics that will not plug your oil glands (are noncomedogenic). Medicines  Take over-the-counter and prescription medicines only as told by your health care provider.  If you were prescribed an antibiotic medicine, apply or take it as told by your health care provider. Do not stop taking the antibiotic even if your condition improves. General Instructions  Keep your hair clean and off of your face. If you have oily hair, shampoo your hair regularly or daily.  Avoid leaning your chin or forehead against your hands.  Avoid wearing tight headbands or hats.  Avoid picking or squeezing your pimples. That can make your acne worse and cause scarring.  Keep all follow-up visits as told by your health care provider. This is important.  Shave gently and  only when necessary.  Keep a food journal to figure out if any foods are linked with your acne. SEEK MEDICAL CARE IF:  Your acne is not better after eight weeks.  Your acne gets worse.  You have a large area of skin that is red or tender.  You think that you are having side effects from any acne medicine.   This information is not intended to replace advice given to you by your health care provider. Make sure you discuss any questions you have with your health care provider.   Document Released: 12/27/1999 Document Revised: 09/19/2014 Document Reviewed: 03/07/2014 Elsevier Interactive Patient Education [employer]
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
## 2015-01-10 ENCOUNTER — Encounter: Payer: Self-pay | Admitting: Family Medicine

## 2015-01-16 ENCOUNTER — Encounter (HOSPITAL_COMMUNITY): Payer: Self-pay | Admitting: Emergency Medicine

## 2015-01-16 ENCOUNTER — Emergency Department (HOSPITAL_COMMUNITY)
Admission: EM | Admit: 2015-01-16 | Discharge: 2015-01-16 | Disposition: A | Discharge status: Home / Self Care | Payer: 59 | Source: Home / Self Care

## 2015-01-16 DIAGNOSIS — H6692 Otitis media, unspecified, left ear: Secondary | ICD-10-CM | POA: Diagnosis not present

## 2015-01-16 DIAGNOSIS — J069 Acute upper respiratory infection, unspecified: Secondary | ICD-10-CM

## 2015-01-16 MED ORDER — AMOXICILLIN 500 MG PO CAPS: 500.0000 mg | ORAL_CAPSULE | Freq: Three times a day (TID) | ORAL | Status: DC
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
## 2015-01-16 NOTE — Discharge Instructions (Signed)
Upper Respiratory Infection, Adult Most upper respiratory infections (URIs) are a viral infection of the air passages leading to the lungs. A URI affects the nose, throat, and upper air passages. The most common type of URI is nasopharyngitis and is typically referred to as "the common cold." URIs run their course and usually go away on their own. Most of the time, a URI does not require medical attention, but sometimes a bacterial infection in the upper airways can follow a viral infection. This is called a secondary infection. Sinus and middle ear infections are common types of secondary upper respiratory infections. Bacterial pneumonia can also complicate a URI. A URI can worsen asthma and chronic obstructive pulmonary disease (COPD). Sometimes, these complications can require emergency medical care and may be life threatening.  CAUSES Almost all URIs are caused by viruses. A virus is a type of germ and can spread from one person to another.  RISKS FACTORS You may be at risk for a URI if:   You smoke.   You have chronic heart or lung disease.  You have a weakened defense (immune) system.   You are very young or very old.   You have nasal allergies or asthma.  You work in crowded or poorly ventilated areas.  You work in health care facilities or schools. SIGNS AND SYMPTOMS  Symptoms typically develop 2-3 days after you come in contact with a cold virus. Most viral URIs last 7-10 days. However, viral URIs from the influenza virus (flu virus) can last 14-18 days and are typically more severe. Symptoms may include:   Runny or stuffy (congested) nose.   Sneezing.   Cough.   Sore throat.   Headache.   Fatigue.   Fever.   Loss of appetite.   Pain in your forehead, behind your eyes, and over your cheekbones (sinus pain).  Muscle aches.  DIAGNOSIS  Your health care provider may diagnose a URI by:  Physical exam.  Tests to check that your symptoms are not due to  another condition such as:  Strep throat.  Sinusitis.  Pneumonia.  Asthma. TREATMENT  A URI goes away on its own with time. It cannot be cured with medicines, but medicines may be prescribed or recommended to relieve symptoms. Medicines may help:  Reduce your fever.  Reduce your cough.  Relieve nasal congestion. HOME CARE INSTRUCTIONS   Take medicines only as directed by your health care provider.   Gargle warm saltwater or take cough drops to comfort your throat as directed by your health care provider.  Use a warm mist humidifier or inhale steam from a shower to increase air moisture. This may make it easier to breathe.  Drink enough fluid to keep your urine clear or pale yellow.   Eat soups and other clear broths and maintain good nutrition.   Rest as needed.   Return to work when your temperature has returned to normal or as your health care provider advises. You may need to stay home longer to avoid infecting others. You can also use a face mask and careful hand washing to prevent spread of the virus.  Increase the usage of your inhaler if you have asthma.   Do not use any tobacco products, including cigarettes, chewing tobacco, or electronic cigarettes. If you need help quitting, ask your health care provider. PREVENTION  The best way to protect yourself from getting a cold is to practice good hygiene.   Avoid oral or hand contact with people with cold  symptoms.   Wash your hands often if contact occurs.  There is no clear evidence that vitamin C, vitamin E, echinacea, or exercise reduces the chance of developing a cold. However, it is always recommended to get plenty of rest, exercise, and practice good nutrition.  SEEK MEDICAL CARE IF:   You are getting worse rather than better.   Your symptoms are not controlled by medicine.   You have chills.  You have worsening shortness of breath.  You have brown or red mucus.  You have yellow or brown nasal  discharge.  You have pain in your face, especially when you bend forward.  You have a fever.  You have swollen neck glands.  You have pain while swallowing.  You have white areas in the back of your throat. SEEK IMMEDIATE MEDICAL CARE IF:   You have severe or persistent:  Headache.  Ear pain.  Sinus pain.  Chest pain.  You have chronic lung disease and any of the following:  Wheezing.  Prolonged cough.  Coughing up blood.  A change in your usual mucus.  You have a stiff neck.  You have changes in your:  Vision.  Hearing.  Thinking.  Mood. MAKE SURE YOU:   Understand these instructions.  Will watch your condition.  Will get help right away if you are not doing well or get worse.   This information is not intended to replace advice given to you by your health care provider. Make sure you discuss any questions you have with your health care provider.   Document Released: 06/24/2000 Document Revised: 05/15/2014 Document Reviewed: 04/05/2013 Elsevier Interactive Patient Education 2016 Elsevier Inc. Otitis Media, Adult Otitis media is redness, soreness, and puffiness (swelling) in the space just behind your eardrum (middle ear). It may be caused by allergies or infection. It often happens along with a cold. HOME CARE  Take your medicine as told. Finish it even if you start to feel better.  Only take over-the-counter or prescription medicines for pain, discomfort, or fever as told by your doctor.  Follow up with your doctor as told. GET HELP IF:  You have otitis media only in one ear, or bleeding from your nose, or both.  You notice a lump on your neck.  You are not getting better in 3-5 days.  You feel worse instead of better. GET HELP RIGHT AWAY IF:   You have pain that is not helped with medicine.  You have puffiness, redness, or pain around your ear.  You get a stiff neck.  You cannot move part of your face (paralysis).  You notice  that the bone behind your ear hurts when you touch it. MAKE SURE YOU:   Understand these instructions.  Will watch your condition.  Will get help right away if you are not doing well or get worse.   This information is not intended to replace advice given to you by your health care provider. Make sure you discuss any questions you have with your health care provider.   Document Released: 06/17/2007 Document Revised: 01/19/2014 Document Reviewed: 07/26/2012 Elsevier Interactive Patient Education [employer] Elsevier Inc[employer]

## 2015-01-16 NOTE — ED Notes (Signed)
Pt has been suffering from hot flashes, cold chills, left ear pain, sore throat and generalized body aches for 12 days.  Pt has been taking several OTC medications to relieve her symptoms but nothing has worked.

## 2015-01-21 ENCOUNTER — Ambulatory Visit: Payer: Self-pay | Admitting: Family Medicine

## 2015-01-30 ENCOUNTER — Encounter (HOSPITAL_COMMUNITY): Payer: Self-pay | Admitting: *Deleted

## 2015-01-30 ENCOUNTER — Emergency Department (HOSPITAL_COMMUNITY)
Admission: EM | Admit: 2015-01-30 | Discharge: 2015-01-31 | Disposition: A | Discharge status: Home / Self Care | Payer: 59 | Attending: Emergency Medicine | Admitting: Emergency Medicine

## 2015-01-30 DIAGNOSIS — M419 Scoliosis, unspecified: Secondary | ICD-10-CM | POA: Diagnosis not present

## 2015-01-30 DIAGNOSIS — Z793 Long term (current) use of hormonal contraceptives: Secondary | ICD-10-CM | POA: Insufficient documentation

## 2015-01-30 DIAGNOSIS — Z79899 Other long term (current) drug therapy: Secondary | ICD-10-CM | POA: Diagnosis not present

## 2015-01-30 DIAGNOSIS — Z87891 Personal history of nicotine dependence: Secondary | ICD-10-CM | POA: Diagnosis not present

## 2015-01-30 DIAGNOSIS — G8929 Other chronic pain: Secondary | ICD-10-CM | POA: Diagnosis not present

## 2015-01-30 DIAGNOSIS — Z862 Personal history of diseases of the blood and blood-forming organs and certain disorders involving the immune mechanism: Secondary | ICD-10-CM | POA: Diagnosis not present

## 2015-01-30 DIAGNOSIS — Z8619 Personal history of other infectious and parasitic diseases: Secondary | ICD-10-CM | POA: Insufficient documentation

## 2015-01-30 DIAGNOSIS — R109 Unspecified abdominal pain: Secondary | ICD-10-CM

## 2015-01-30 DIAGNOSIS — Q828 Other specified congenital malformations of skin: Secondary | ICD-10-CM | POA: Diagnosis not present

## 2015-01-30 DIAGNOSIS — Z3202 Encounter for pregnancy test, result negative: Secondary | ICD-10-CM | POA: Insufficient documentation

## 2015-01-30 DIAGNOSIS — N939 Abnormal uterine and vaginal bleeding, unspecified: Secondary | ICD-10-CM | POA: Diagnosis present

## 2015-01-30 DIAGNOSIS — N926 Irregular menstruation, unspecified: Secondary | ICD-10-CM | POA: Insufficient documentation

## 2015-01-30 DIAGNOSIS — K219 Gastro-esophageal reflux disease without esophagitis: Secondary | ICD-10-CM | POA: Diagnosis not present

## 2015-01-30 DIAGNOSIS — Z872 Personal history of diseases of the skin and subcutaneous tissue: Secondary | ICD-10-CM | POA: Diagnosis not present

## 2015-01-30 LAB — CBC
HCT: 39.9 % (ref 36.0–46.0)
Hemoglobin: 13.7 g/dL (ref 12.0–15.0)
MCH: 29 pg (ref 26.0–34.0)
MCHC: 34.3 g/dL (ref 30.0–36.0)
MCV: 84.4 fL (ref 78.0–100.0)
PLATELETS: 668 10*3/uL — AB (ref 150–400)
RBC: 4.73 MIL/uL (ref 3.87–5.11)
RDW: 13.4 % (ref 11.5–15.5)
WBC: 5.9 10*3/uL (ref 4.0–10.5)

## 2015-01-30 LAB — COMPREHENSIVE METABOLIC PANEL
ALK PHOS: 65 U/L (ref 38–126)
ALT: 13 U/L — AB (ref 14–54)
AST: 15 U/L (ref 15–41)
Albumin: 4.7 g/dL (ref 3.5–5.0)
Anion gap: 11 (ref 5–15)
BILIRUBIN TOTAL: 0.3 mg/dL (ref 0.3–1.2)
BUN: 16 mg/dL (ref 6–20)
CALCIUM: 10.3 mg/dL (ref 8.9–10.3)
CO2: 26 mmol/L (ref 22–32)
CREATININE: 0.94 mg/dL (ref 0.44–1.00)
Chloride: 105 mmol/L (ref 101–111)
GFR calc Af Amer: >60 mL/min (ref >60)
Glucose, Bld: 97 mg/dL (ref 65–99)
Potassium: 3.9 mmol/L (ref 3.5–5.1)
Sodium: 142 mmol/L (ref 135–145)
TOTAL PROTEIN: 8.6 g/dL — AB (ref 6.5–8.1)

## 2015-01-30 LAB — LIPASE, BLOOD: Lipase: 58 U/L — ABNORMAL HIGH (ref 11–51)

## 2015-01-30 LAB — I-STAT BETA HCG BLOOD, ED (MC, WL, AP ONLY): I-stat hCG, quantitative: <5 m[IU]/mL (ref <5)

## 2015-01-30 NOTE — ED Notes (Signed)
Pt states she has had cramping and bleeding since Monday. Pt states she had a positive pregnancy test on Friday, then had pink-white vaginal discharge on Sunday, along with nausea and vomiting. Pt states the cramping feels like when she was dilated for her IUD.

## 2015-01-31 MED ORDER — NABUMETONE 500 MG PO TABS: 500.0000 mg | ORAL_TABLET | Freq: Two times a day (BID) | ORAL | Status: AC

## 2015-01-31 NOTE — Discharge Instructions (Signed)
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
## 2015-02-07 ENCOUNTER — Encounter (HOSPITAL_COMMUNITY): Payer: Self-pay | Admitting: Psychiatry

## 2015-02-07 ENCOUNTER — Ambulatory Visit (INDEPENDENT_AMBULATORY_CARE_PROVIDER_SITE_OTHER): Payer: 59 | Admitting: Psychiatry

## 2015-02-07 VITALS — BP 114/76 | HR 83 | Ht 65.0 in | Wt 145.6 lb

## 2015-02-07 DIAGNOSIS — F331 Major depressive disorder, recurrent, moderate: Secondary | ICD-10-CM | POA: Diagnosis not present

## 2015-02-07 DIAGNOSIS — F4010 Social phobia, unspecified: Secondary | ICD-10-CM | POA: Insufficient documentation

## 2015-02-07 DIAGNOSIS — F4310 Post-traumatic stress disorder, unspecified: Secondary | ICD-10-CM | POA: Insufficient documentation

## 2015-02-07 DIAGNOSIS — G4700 Insomnia, unspecified: Secondary | ICD-10-CM

## 2015-02-07 DIAGNOSIS — F411 Generalized anxiety disorder: Secondary | ICD-10-CM

## 2015-02-07 MED ORDER — TRAZODONE HCL 50 MG PO TABS: ORAL_TABLET | ORAL | Status: AC

## 2015-02-07 MED ORDER — VENLAFAXINE HCL ER 75 MG PO CP24: 75.0000 mg | ORAL_CAPSULE | Freq: Every day | ORAL | Status: AC
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
## 2015-02-11 ENCOUNTER — Ambulatory Visit: Payer: Self-pay | Admitting: Gastroenterology

## 2015-02-11 ENCOUNTER — Telehealth (HOSPITAL_COMMUNITY): Payer: Self-pay

## 2015-02-11 NOTE — Telephone Encounter (Signed)
Medication problems - Pt. came in stating she is still not sleeping.  Reports now takng Trazodone 50 mg, 2 at bedtime but continues to have broken sleep each night and never sleeps more than 3-4 hours.  Pt. also concerned about nausea and dizziness with Effexor XR.  States this has been continuous and not really improving since started medication.  States limited appetite and when she does eat still has GI upset and nervousness.  Discussed side effects to Effexor XR and ambulatory and blood pressure precautions as she continues to try to get use to the new medication.  Agreed to discuss with Dr. Michae Kava as she reports this does not seem to be going away so she is concerned this may not be transient.  Patient also wanted to make sure her FMLA paperwork is currently being completed as reports she does not feel she can work currently.  Agreed to send patient concerns to Dr. Michae Kava who will return to our office on 02/12/15 and patient agreed with plan.  Patient agreed to call back 02/12/15 evening to follow up if had not heard back from by then and requests call for discussion of medications with Dr. Michae Kava.

## 2015-02-15 NOTE — Telephone Encounter (Signed)
Medication management - Patient came by to pick up needed FMLA forms for work and informed her Dr. Michae Kava had been out sick all week, that the forms were prepared for pick up but had not been signed.  Requested patient call back on Tuesday 02/19/15 to follow up on needed forms.  Patient upset forms not prepared but reported agreement with plan.  Informed patient our office would be glad to verify with her employer her provider had been out this entire week and we would send this in as soon as Dr. Michae Kava returns. Contacted [employer] with Arna [employer]i, representative to discuss when forms are due for patient.  Representative reported the claim was closed as of 10/12/15 but it appeared patient was appealing the decision and had 180 days from the time of initial request 10/05/14 which was her first day of missed work.  Called patient and got her voice mail to provide this information.  Requested patient call her company and Loletta Parish as they requested to verify and finalize last day her appeal period would be open and requested patient call our office back in the coming week to assist her with making sure any information needed is submitted on time.  Informed Dr. Michae Kava would be back in the office on Tuesday 02/19/15 and requested patient call back then to assist.

## 2015-02-19 NOTE — Telephone Encounter (Signed)
Pt asked to come pick up her signed [employer] forms.  Pt states she is having broken sleep all night and is getting about 4 hrs. Pt takes Trazodone  at 8pm but is not falling asleep until 5am. States she takes Effexor XR at 9-11am and it makes her feel wired and jittery. States the feeling lasts all day and she began this medication on Jan 27th. Pt states it is helping with the depression.   A/P: MDD-recurrent, moderate; GAD; PTSD; Social anxiety disorder  Increase Trazodone to 150 po qHS prn insomnia Continue Effexor XR  po qD for mood and anxiety Pt will call back if sleep does not improve.

## 2015-02-20 ENCOUNTER — Telehealth (HOSPITAL_COMMUNITY): Payer: Self-pay

## 2015-02-20 NOTE — Telephone Encounter (Signed)
Medication mangement - Patient came in to pick up Disability Leave forms for her job but reports she needs it to specify dates 11/23/14-02/07/15 and also needs a letter stating Dr. Michae Kava was out the previous week.  Agreed to contact Dr. Michae Kava with requests and patient to return on 02/21/15 to pick up forms once Dr. Michae Kava prepares needed letter.

## 2015-02-21 NOTE — Telephone Encounter (Signed)
Telephone call with patient after discussing needed disability paperwork with Dr. Doyne Keel and reviewing forms to inform Dr. Doyne Keel could not put on the forms patient was taken out of work from 11/23/14 to 02/07/15 as she had never seen patient before 02/07/15 and could not attest to any time prior to their first evaluation.  Patient stated she understood this but did request a letter to verify Dr. Doyne Keel was out of the office the previous week for her employer.  Met with Dr. Doyne Keel who agreed with this plan and letter provided. Dr. Doyne Keel signed needed letter and left at front for patient to pick up.

## 2015-03-07 ENCOUNTER — Ambulatory Visit: Payer: Self-pay | Admitting: Family Medicine

## 2015-03-13 ENCOUNTER — Telehealth (HOSPITAL_COMMUNITY): Payer: Self-pay

## 2015-03-18 ENCOUNTER — Ambulatory Visit: Payer: Self-pay | Admitting: Family Medicine

## 2015-03-19 NOTE — Telephone Encounter (Signed)
Pt needs to call [employer] to reopen her file for review.

## 2015-03-19 NOTE — Telephone Encounter (Signed)
I called the patient and let her know, she is going to call tomorrow

## 2015-04-09 ENCOUNTER — Ambulatory Visit (HOSPITAL_COMMUNITY): Payer: Self-pay | Admitting: Psychiatry
